# Patient Record
Sex: Male | Born: 1975 | Hispanic: No | Marital: Married | Smoking: Never smoker
Health system: Southern US, Community
[De-identification: ages and names within clinical notes are randomized; demographics above are authoritative.]

## PROBLEM LIST (undated history)

## (undated) DIAGNOSIS — I10 Essential (primary) hypertension: Secondary | ICD-10-CM

## (undated) DIAGNOSIS — E119 Type 2 diabetes mellitus without complications: Secondary | ICD-10-CM

---

## 2011-12-29 ENCOUNTER — Ambulatory Visit: Payer: PRIVATE HEALTH INSURANCE

## 2016-04-07 ENCOUNTER — Emergency Department (HOSPITAL_COMMUNITY)
Admission: EM | Admit: 2016-04-07 | Discharge: 2016-04-07 | Disposition: A | Discharge status: Home / Self Care | Payer: 59 | Attending: Emergency Medicine | Admitting: Emergency Medicine

## 2016-04-07 ENCOUNTER — Encounter (HOSPITAL_COMMUNITY): Payer: Self-pay | Admitting: *Deleted

## 2016-04-07 ENCOUNTER — Emergency Department (HOSPITAL_COMMUNITY): Payer: 59

## 2016-04-07 DIAGNOSIS — E1122 Type 2 diabetes mellitus with diabetic chronic kidney disease: Secondary | ICD-10-CM | POA: Diagnosis not present

## 2016-04-07 DIAGNOSIS — R1013 Epigastric pain: Secondary | ICD-10-CM | POA: Diagnosis not present

## 2016-04-07 DIAGNOSIS — N189 Chronic kidney disease, unspecified: Secondary | ICD-10-CM | POA: Diagnosis not present

## 2016-04-07 DIAGNOSIS — I129 Hypertensive chronic kidney disease with stage 1 through stage 4 chronic kidney disease, or unspecified chronic kidney disease: Secondary | ICD-10-CM | POA: Diagnosis not present

## 2016-04-07 DIAGNOSIS — R1012 Left upper quadrant pain: Secondary | ICD-10-CM | POA: Insufficient documentation

## 2016-04-07 HISTORY — DX: Type 2 diabetes mellitus without complications: E11.9

## 2016-04-07 HISTORY — DX: Essential (primary) hypertension: I10

## 2016-04-07 LAB — CBC
HCT: 46.8 % (ref 39.0–52.0)
HEMOGLOBIN: 15.8 g/dL (ref 13.0–17.0)
MCH: 28.3 pg (ref 26.0–34.0)
MCHC: 33.8 g/dL (ref 30.0–36.0)
MCV: 83.9 fL (ref 78.0–100.0)
PLATELETS: 278 10*3/uL (ref 150–400)
RBC: 5.58 MIL/uL (ref 4.22–5.81)
RDW: 12.7 % (ref 11.5–15.5)
WBC: 8.4 10*3/uL (ref 4.0–10.5)

## 2016-04-07 LAB — COMPREHENSIVE METABOLIC PANEL
ALT: 18 U/L (ref 17–63)
AST: 17 U/L (ref 15–41)
Albumin: 4.2 g/dL (ref 3.5–5.0)
Alkaline Phosphatase: 87 U/L (ref 38–126)
Anion gap: 7 (ref 5–15)
BILIRUBIN TOTAL: 1 mg/dL (ref 0.3–1.2)
BUN: 16 mg/dL (ref 6–20)
CALCIUM: 9.4 mg/dL (ref 8.9–10.3)
CO2: 27 mmol/L (ref 22–32)
CREATININE: 1.6 mg/dL — AB (ref 0.61–1.24)
Chloride: 103 mmol/L (ref 101–111)
GFR calc Af Amer: >60 mL/min (ref >60)
GFR, EST NON AFRICAN AMERICAN: 52 mL/min — AB (ref >60)
Glucose, Bld: 89 mg/dL (ref 65–99)
POTASSIUM: 4.1 mmol/L (ref 3.5–5.1)
Sodium: 137 mmol/L (ref 135–145)
TOTAL PROTEIN: 7.8 g/dL (ref 6.5–8.1)

## 2016-04-07 LAB — LIPASE, BLOOD: Lipase: 39 U/L (ref 11–51)

## 2016-04-07 LAB — TROPONIN I

## 2016-04-07 MED ORDER — IOPAMIDOL (ISOVUE-300) INJECTION 61%
100.0000 mL | Freq: Once | INTRAVENOUS | Status: AC | PRN
  Administered 2016-04-07: 100 mL via INTRAVENOUS

## 2016-04-07 MED ORDER — GI COCKTAIL ~~LOC~~
30.0000 mL | Freq: Once | ORAL | Status: AC
  Administered 2016-04-07: 30 mL via ORAL
  Filled 2016-04-07: qty 30

## 2016-04-07 NOTE — Discharge Instructions (Signed)
Abdominal Pain, Adult Take Prilosec OTC as directed. Contact Dr. Mardelle MatteAndy tomorrow for referral to a gastroenterologist Many things can cause abdominal pain. Usually, abdominal pain is not caused by a disease and will improve without treatment. It can often be observed and treated at home. Your health care provider will do a physical exam and possibly order blood tests and X-rays to help determine the seriousness of your pain. However, in many cases, more time must pass before a clear cause of the pain can be found. Before that point, your health care provider may not know if you need more testing or further treatment. HOME CARE INSTRUCTIONS Monitor your abdominal pain for any changes. The following actions may help to alleviate any discomfort you are experiencing:  Only take over-the-counter or prescription medicines as directed by your health care provider.  Do not take laxatives unless directed to do so by your health care provider.  Try a clear liquid diet (broth, tea, or water) as directed by your health care provider. Slowly move to a bland diet as tolerated. SEEK MEDICAL CARE IF:  You have unexplained abdominal pain.  You have abdominal pain associated with nausea or diarrhea.  You have pain when you urinate or have a bowel movement.  You experience abdominal pain that wakes you in the night.  You have abdominal pain that is worsened or improved by eating food.  You have abdominal pain that is worsened with eating fatty foods.  You have a fever. SEEK IMMEDIATE MEDICAL CARE IF:  Your pain does not go away within 2 hours.  You keep throwing up (vomiting).  Your pain is felt only in portions of the abdomen, such as the right side or the left lower portion of the abdomen.  You pass bloody or black tarry stools. MAKE SURE YOU:  Understand these instructions.  Will watch your condition.  Will get help right away if you are not doing well or get worse.   This information is not  intended to replace advice given to you by your health care provider. Make sure you discuss any questions you have with your health care provider.   Document Released: 08/05/2005 Document Revised: 07/17/2015 Document Reviewed: 07/05/2013 Elsevier Interactive Patient Education [employer]2016 Elsevier Inc.

## 2016-04-07 NOTE — ED Notes (Signed)
Pt c/o LUQ pain onset x 1 mth, pt seen @ PCP office x 5-6 times for his symptoms, pt denies n/v/d, pt c/o bil lower back onset x 1 wk, pt reports dysuria, denies hematuria & urinary frequency, denies SOB & CP, A&O x4

## 2016-04-07 NOTE — ED Provider Notes (Signed)
CSN: [id number]     Arrival date & time 04/07/16  1201 History   First MD Initiated Contact with Patient 04/07/16 1314     Chief Complaint  Patient presents with  . Abdominal Pain  . Back Pain     (Consider location/radiation/quality/duration/timing/severity/associated sxs/prior Treatment) HPI Complains of epigastric abdominal pain and left upper quadrant pain rating to left back for one month. Pain waxes and wanes nothing makes pain better or worse. Patient reports she's had abdominal ultrasounds which were negative, ordered by his primary care physician. Treated with tramadol with transient relief. Pain is not affected by eating or by exertion. No other associated symptoms. Denies urinary symptoms. Denies fever. Past Medical History  Diagnosis Date  . Diabetes mellitus without complication (HCC)   . Hypertension    History reviewed. No pertinent past surgical history. No family history on file. Social History  Substance Use Topics  . Smoking status: Never Smoker   . Smokeless tobacco: None  . Alcohol Use: No    Review of Systems  Constitutional: Negative.   HENT: Negative.   Respiratory: Negative.   Cardiovascular: Negative.   Gastrointestinal: Positive for abdominal pain.  Musculoskeletal: Positive for back pain.  Skin: Negative.   Allergic/Immunologic: Positive for immunocompromised state.       Diabetic  Neurological: Negative.   Psychiatric/Behavioral: Negative.   All other systems reviewed and are negative.     Allergies  Review of patient's allergies indicates no known allergies.  Home Medications   Prior to Admission medications   Not on File   BP 134/87 mmHg  Pulse 64  Temp(Src) 97.9 F (36.6 C) (Oral)  Resp 18  Ht 5\' 5"  (1.651 m)  Wt 154 lb 6 oz (70.024 kg)  BMI 25.69 kg/m2  SpO2 98% Physical Exam  Constitutional: He appears well-developed and well-nourished.  HENT:  Head: Normocephalic and atraumatic.  Eyes: Conjunctivae are normal. Pupils  are equal, round, and reactive to light.  Neck: Neck supple. No tracheal deviation present. No thyromegaly present.  Cardiovascular: Normal rate and regular rhythm.   No murmur heard. Pulmonary/Chest: Effort normal and breath sounds normal.  Abdominal: Soft. Bowel sounds are normal. He exhibits no distension and no mass. There is tenderness. There is no rebound and no guarding.  Tender at epigastrium and left upper quadrant  Genitourinary: Penis normal.  Normal genitalia no no flank tenderness  Musculoskeletal: Normal range of motion. He exhibits no edema or tenderness.  Neurological: He is alert. Coordination normal.  Skin: Skin is warm and dry. No rash noted.  Psychiatric: He has a normal mood and affect.  Nursing note and vitals reviewed.   ED Course  Procedures (including critical care time) Labs Review Labs Reviewed  COMPREHENSIVE METABOLIC PANEL - Abnormal; Notable for the following:    Creatinine, Ser 1.60 (*)    GFR calc non Af Amer 52 (*)    All other components within normal limits  LIPASE, BLOOD  CBC  URINALYSIS, ROUTINE W REFLEX MICROSCOPIC (NOT AT [facility])  TROPONIN I    Imaging Review No results found. I have personally reviewed and evaluated these images and lab results as part of my medical decision-making.   EKG Interpretation None     4:40 PM discomfort improved after treatment GI cocktail. Results for orders placed or performed during the hospital encounter of 04/07/16  Lipase, blood  Result Value Ref Range   Lipase 39 11 - 51 U/L  Comprehensive metabolic panel  Result Value Ref Range  Sodium 137 135 - 145 mmol/L   Potassium 4.1 3.5 - 5.1 mmol/L   Chloride 103 101 - 111 mmol/L   CO2 27 22 - 32 mmol/L   Glucose, Bld 89 65 - 99 mg/dL   BUN 16 6 - 20 mg/dL   Creatinine, Ser 1.61 (H) 0.61 - 1.24 mg/dL   Calcium 9.4 8.9 - 09.6 mg/dL   Total Protein 7.8 6.5 - 8.1 g/dL   Albumin 4.2 3.5 - 5.0 g/dL   AST 17 15 - 41 U/L   ALT 18 17 - 63 U/L   Alkaline  Phosphatase 87 38 - 126 U/L   Total Bilirubin 1.0 0.3 - 1.2 mg/dL   GFR calc non Af Amer 52 (L) >60 mL/min   GFR calc Af Amer >60 >60 mL/min   Anion gap 7 5 - 15  CBC  Result Value Ref Range   WBC 8.4 4.0 - 10.5 K/uL   RBC 5.58 4.22 - 5.81 MIL/uL   Hemoglobin 15.8 13.0 - 17.0 g/dL   HCT 04.5 40.9 - 81.1 %   MCV 83.9 78.0 - 100.0 fL   MCH 28.3 26.0 - 34.0 pg   MCHC 33.8 30.0 - 36.0 g/dL   RDW 91.4 78.2 - 95.6 %   Platelets 278 150 - 400 K/uL  Troponin I  Result Value Ref Range   Troponin I <0.03 <0.031 ng/mL   Ct Abdomen Pelvis W Contrast  04/07/2016  CLINICAL DATA:  Abdominal pain and tenderness for 1 month EXAM: CT ABDOMEN AND PELVIS WITH CONTRAST TECHNIQUE: Multidetector CT imaging of the abdomen and pelvis was performed using the standard protocol following bolus administration of intravenous contrast. CONTRAST:  ISOVUE-300 IOPAMIDOL (ISOVUE-300) INJECTION 61% COMPARISON:  None. FINDINGS: Lower chest: There is minimal scarring in the inferior lateral left base. Lung bases otherwise are clear. Hepatobiliary: There is a degree of hepatic steatosis. No focal liver lesions are identified gallbladder wall is not appreciably thickened. There is no biliary duct dilatation. Pancreas: No pancreatic lesions are evident. Spleen: No splenic lesions are evident. Adrenals/Urinary Tract: Adrenals appear unremarkable. There are scattered areas of renal scarring on the right. There is a cyst in the lower pole the right kidney anteriorly measuring 1.4 x 1.3 cm. There is a cyst nearby in the lower pole right kidney measuring 1.0 x 0.8 cm. There are two adjacent cysts in the left frontal region measuring 0.8 x 0.7 cm and 1.1 x 1.0 cm respectively. There is mild scarring in the lateral left kidney region. There is no hydronephrosis on either side. There is no renal or ureteral calculus on either side. Urinary bladder is midline with wall thickness within normal limits. Stomach/Bowel: There is no bowel wall  or mesenteric thickening. No bowel obstruction. No free air or portal venous air. Vascular/Lymphatic: There are foci of atherosclerotic calcification in the distal aorta and proximal common iliac arteries. No abdominal aortic aneurysm is evident. The major mesenteric vessels appear patent. A circumaortic left renal vein is noted, an anatomic variant. Reproductive: Prostate and seminal vesicles appear unremarkable. There is no pelvic mass or pelvic fluid collection. Other: The appendix appears normal. There is no ascites or abscess in the abdomen or pelvis. Musculoskeletal: There is disc degeneration at L5-S1. There are no blastic or lytic bone lesions. No intramuscular or abdominal wall lesion. IMPRESSION: No bowel obstruction or bowel wall thickening. No abscess. Appendix appears normal. No renal or ureteral calculus.  No hydronephrosis. There is a degree of hepatic steatosis without  focal liver lesion evident. Electronically Signed   By: Bretta Bang III M.D.   On: 04/07/2016 15:24    MDM   case discussed with patient primary care physician Dr. Mardelle Matte. Creatinine of 1.6 is his baseline. Plan Prilosec OTC. Patient is to contact Dr. Mardelle Matte for referral to gastroenterologist  Diagnosis #1abdominal pain  #2 chronic renal insufficiency  Final diagnoses:  None        Doug Sou, MD 04/07/16 1643

## 2016-04-07 NOTE — ED Notes (Signed)
Patient d/c'd from IV, monitor, continuous pulse oximetry and blood pressure cuff; patient getting dressed to be discharged home 

## 2016-04-07 NOTE — ED Notes (Signed)
Checked to see if patient could provide an urine specimen; patient states that he will attempt

## 2016-04-07 NOTE — ED Notes (Signed)
Patient placed on continuous pulse oximetry and blood pressure cuff; Erin, RN handed patient an urinal to attempt to provide an urine sample

## 2016-04-07 NOTE — ED Notes (Signed)
Patient getting undressed and into a gown at this time 

## 2016-04-12 ENCOUNTER — Encounter (HOSPITAL_COMMUNITY): Payer: Self-pay

## 2016-04-12 ENCOUNTER — Emergency Department (HOSPITAL_COMMUNITY)
Admission: EM | Admit: 2016-04-12 | Discharge: 2016-04-13 | Disposition: A | Discharge status: Home / Self Care | Payer: 59 | Attending: Emergency Medicine | Admitting: Emergency Medicine

## 2016-04-12 DIAGNOSIS — R1013 Epigastric pain: Secondary | ICD-10-CM | POA: Diagnosis not present

## 2016-04-12 DIAGNOSIS — E119 Type 2 diabetes mellitus without complications: Secondary | ICD-10-CM | POA: Diagnosis not present

## 2016-04-12 DIAGNOSIS — Z7984 Long term (current) use of oral hypoglycemic drugs: Secondary | ICD-10-CM | POA: Insufficient documentation

## 2016-04-12 DIAGNOSIS — Z79899 Other long term (current) drug therapy: Secondary | ICD-10-CM | POA: Diagnosis not present

## 2016-04-12 DIAGNOSIS — R109 Unspecified abdominal pain: Secondary | ICD-10-CM | POA: Diagnosis present

## 2016-04-12 DIAGNOSIS — I10 Essential (primary) hypertension: Secondary | ICD-10-CM | POA: Insufficient documentation

## 2016-04-12 LAB — COMPREHENSIVE METABOLIC PANEL
ALT: 14 U/L — AB (ref 17–63)
AST: 16 U/L (ref 15–41)
Albumin: 4 g/dL (ref 3.5–5.0)
Alkaline Phosphatase: 87 U/L (ref 38–126)
Anion gap: 7 (ref 5–15)
BUN: 16 mg/dL (ref 6–20)
CHLORIDE: 102 mmol/L (ref 101–111)
CO2: 27 mmol/L (ref 22–32)
CREATININE: 1.58 mg/dL — AB (ref 0.61–1.24)
Calcium: 9.6 mg/dL (ref 8.9–10.3)
GFR calc non Af Amer: 53 mL/min — ABNORMAL LOW (ref >60)
Glucose, Bld: 121 mg/dL — ABNORMAL HIGH (ref 65–99)
Potassium: 4 mmol/L (ref 3.5–5.1)
SODIUM: 136 mmol/L (ref 135–145)
Total Bilirubin: 0.9 mg/dL (ref 0.3–1.2)
Total Protein: 7.8 g/dL (ref 6.5–8.1)

## 2016-04-12 LAB — URINE MICROSCOPIC-ADD ON

## 2016-04-12 LAB — CBC
HEMATOCRIT: 48.1 % (ref 39.0–52.0)
Hemoglobin: 16.9 g/dL (ref 13.0–17.0)
MCH: 28.6 pg (ref 26.0–34.0)
MCHC: 35.1 g/dL (ref 30.0–36.0)
MCV: 81.5 fL (ref 78.0–100.0)
PLATELETS: 287 10*3/uL (ref 150–400)
RBC: 5.9 MIL/uL — AB (ref 4.22–5.81)
RDW: 12 % (ref 11.5–15.5)
WBC: 10.1 10*3/uL (ref 4.0–10.5)

## 2016-04-12 LAB — URINALYSIS, ROUTINE W REFLEX MICROSCOPIC
Bilirubin Urine: NEGATIVE
GLUCOSE, UA: NEGATIVE mg/dL
Ketones, ur: NEGATIVE mg/dL
Leukocytes, UA: NEGATIVE
Nitrite: NEGATIVE
PH: 6.5 (ref 5.0–8.0)
PROTEIN: 100 mg/dL — AB
SPECIFIC GRAVITY, URINE: 1.012 (ref 1.005–1.030)

## 2016-04-12 LAB — LIPASE, BLOOD: Lipase: 35 U/L (ref 11–51)

## 2016-04-12 MED ORDER — FAMOTIDINE IN NACL 20-0.9 MG/50ML-% IV SOLN
20.0000 mg | Freq: Once | INTRAVENOUS | Status: AC
  Administered 2016-04-13: 20 mg via INTRAVENOUS
  Filled 2016-04-12: qty 50

## 2016-04-12 MED ORDER — SODIUM CHLORIDE 0.9 % IV BOLUS (SEPSIS)
500.0000 mL | Freq: Once | INTRAVENOUS | Status: AC
  Administered 2016-04-13: 500 mL via INTRAVENOUS

## 2016-04-12 MED ORDER — GI COCKTAIL ~~LOC~~
30.0000 mL | Freq: Once | ORAL | Status: AC
  Administered 2016-04-13: 30 mL via ORAL
  Filled 2016-04-12: qty 30

## 2016-04-12 NOTE — ED Provider Notes (Signed)
CSN: [id number]     Arrival date & time 04/12/16  1914 History  By signing my name below, I, Bethel BornBritney McCollum, attest that this documentation has been prepared under the direction and in the presence of Gilda Creasehristopher J Pollina, MD. Electronically Signed: Bethel BornBritney McCollum, ED Scribe. 04/12/2016. 11:25 PM   Chief Complaint  Patient presents with  . Abdominal Pain   The history is provided by the patient. No language interpreter was used.   Kenneth Diaz is a 40 y.o. male with PMHx of DM and HTN who presents to the Emergency Department complaining of constant, 8/10 in severity, waxing and waning, epigastric pain with onset more than 1 month ago. Pt states that he was seen in the ED 4 days ago and prescribed Prilosec which provided some improvement until tonight. Pt denies nausea, vomiting, diarrhea, and hematochezia.    Past Medical History  Diagnosis Date  . Diabetes mellitus without complication (HCC)   . Hypertension    History reviewed. No pertinent past surgical history. No family history on file. Social History  Substance Use Topics  . Smoking status: Never Smoker   . Smokeless tobacco: None  . Alcohol Use: No    Review of Systems  Gastrointestinal: Positive for abdominal pain. Negative for nausea, vomiting, diarrhea and blood in stool.  All other systems reviewed and are negative.  Allergies  Review of patient's allergies indicates no known allergies.  Home Medications   Prior to Admission medications   Medication Sig Start Date End Date Taking? Authorizing Provider  amLODipine (NORVASC) 10 MG tablet Take 10 mg by mouth daily. 01/25/16   Historical Provider, MD  atorvastatin (LIPITOR) 10 MG tablet Take 10 mg by mouth daily. 02/24/16   Historical Provider, MD  cyclobenzaprine (FLEXERIL) 10 MG tablet Take 10 mg by mouth at bedtime. 03/27/16   Historical Provider, MD  lisinopril (PRINIVIL,ZESTRIL) 10 MG tablet Take 10 mg by mouth daily. 01/09/16   Historical Provider, MD  metFORMIN  (GLUCOPHAGE) 500 MG tablet Take 500 mg by mouth 2 (two) times daily. 01/09/16   Historical Provider, MD  ondansetron (ZOFRAN) 4 MG tablet Take 4 mg by mouth every 8 (eight) hours as needed. nausea 03/27/16   Historical Provider, MD  pantoprazole (PROTONIX) 40 MG tablet Take 40 mg by mouth daily. 01/28/16   Historical Provider, MD  ranitidine (ZANTAC) 150 MG tablet Take 1 tablet (150 mg total) by mouth 2 (two) times daily. 04/13/16   Gilda Creasehristopher J Pollina, MD  sucralfate (CARAFATE) 1 GM/10ML suspension Take 10 mLs (1 g total) by mouth 4 (four) times daily -  with meals and at bedtime. 04/13/16   Gilda Creasehristopher J Pollina, MD  traMADol (ULTRAM) 50 MG tablet Take 50 mg by mouth every 6 (six) hours as needed. pain 01/20/16   Historical Provider, MD   BP 158/104 mmHg  Pulse 80  Temp(Src) 97.8 F (36.6 C) (Oral)  Ht 5\' 5"  (1.651 m)  Wt 151 lb 9.6 oz (68.765 kg)  BMI 25.23 kg/m2  SpO2 100% Physical Exam  Constitutional: He is oriented to person, place, and time. He appears well-developed and well-nourished. No distress.  HENT:  Head: Normocephalic and atraumatic.  Right Ear: Hearing normal.  Left Ear: Hearing normal.  Nose: Nose normal.  Mouth/Throat: Oropharynx is clear and moist and mucous membranes are normal.  Eyes: Conjunctivae and EOM are normal. Pupils are equal, round, and reactive to light.  Neck: Normal range of motion. Neck supple.  Cardiovascular: Regular rhythm, S1 normal and S2 normal.  Exam reveals no gallop and no friction rub.   No murmur heard. Pulmonary/Chest: Effort normal and breath sounds normal. No respiratory distress. He exhibits no tenderness.  Abdominal: Soft. Normal appearance and bowel sounds are normal. There is no hepatosplenomegaly. There is tenderness in the epigastric area. There is no rebound, no guarding, no tenderness at McBurney's point and negative Murphy's sign. No hernia.  Musculoskeletal: Normal range of motion.  Neurological: He is alert and oriented to person,  place, and time. He has normal strength. No cranial nerve deficit or sensory deficit. Coordination normal. GCS eye subscore is 4. GCS verbal subscore is 5. GCS motor subscore is 6.  Skin: Skin is warm, dry and intact. No rash noted. No cyanosis.  Psychiatric: He has a normal mood and affect. His speech is normal and behavior is normal. Thought content normal.  Nursing note and vitals reviewed.   ED Course  Procedures (including critical care time) DIAGNOSTIC STUDIES: Oxygen Saturation is 100% on RA,  normal by my interpretation.    COORDINATION OF CARE: 11:22 PM Discussed treatment plan which includes lab work with pt at bedside and pt agreed to plan.  Labs Review Labs Reviewed  COMPREHENSIVE METABOLIC PANEL - Abnormal; Notable for the following:    Glucose, Bld 121 (*)    Creatinine, Ser 1.58 (*)    ALT 14 (*)    GFR calc non Af Amer 53 (*)    All other components within normal limits  CBC - Abnormal; Notable for the following:    RBC 5.90 (*)    All other components within normal limits  URINALYSIS, ROUTINE W REFLEX MICROSCOPIC (NOT AT [facility]) - Abnormal; Notable for the following:    Hgb urine dipstick TRACE (*)    Protein, ur 100 (*)    All other components within normal limits  URINE MICROSCOPIC-ADD ON - Abnormal; Notable for the following:    Squamous Epithelial / LPF 0-5 (*)    Bacteria, UA RARE (*)    All other components within normal limits  LIPASE, BLOOD    Imaging Review No results found. I have personally reviewed and evaluated these lab results as part of my medical decision-making.   EKG Interpretation None      MDM   Final diagnoses:  Epigastric pain    Patient presents to the ER for evaluation of epigastric pain. Reviewing his records reveals that he was seen in the ER this past week. He has been worked up for this by his primary doctor including ultrasound. At time of his last visit, Dr. Rennis Chris discussed the patient with his primary doctor and  arrangements were made for gastroenterology follow-up. Patient indicates that his follow-up visit in 2 days. Patient is already prescribed Protonix. Will add ranitidine and Carafate. Patient improved with analgesia, Pepcid, GI cocktail here in the ER. Examination is benign.  I personally performed the services described in this documentation, which was scribed in my presence. The recorded information has been reviewed and is accurate.    Gilda Crease, MD 04/13/16 [phone]

## 2016-04-12 NOTE — ED Notes (Signed)
patient complains of ongoing epigastric pain since visit on 5/30. Pain worse after eating. No vomiting, no diarrhea.

## 2016-04-13 MED ORDER — SUCRALFATE 1 GM/10ML PO SUSP: 1.0000 g | Freq: Three times a day (TID) | ORAL | Status: DC

## 2016-04-13 MED ORDER — HYDROMORPHONE HCL 1 MG/ML IJ SOLN
1.0000 mg | Freq: Once | INTRAMUSCULAR | Status: AC
  Administered 2016-04-13: 1 mg via INTRAVENOUS
  Filled 2016-04-13: qty 1

## 2016-04-13 MED ORDER — RANITIDINE HCL 150 MG PO TABS: 150.0000 mg | ORAL_TABLET | Freq: Two times a day (BID) | ORAL | Status: DC

## 2016-04-13 MED ORDER — ONDANSETRON HCL 4 MG/2ML IJ SOLN
4.0000 mg | Freq: Once | INTRAMUSCULAR | Status: AC
  Administered 2016-04-13: 4 mg via INTRAVENOUS
  Filled 2016-04-13: qty 2

## 2016-04-13 NOTE — Discharge Instructions (Signed)

## 2016-12-28 ENCOUNTER — Encounter (HOSPITAL_COMMUNITY): Payer: Self-pay

## 2016-12-28 ENCOUNTER — Emergency Department (HOSPITAL_COMMUNITY)
Admission: EM | Admit: 2016-12-28 | Discharge: 2016-12-28 | Disposition: A | Discharge status: Home / Self Care | Payer: Managed Care, Other (non HMO) | Attending: Emergency Medicine | Admitting: Emergency Medicine

## 2016-12-28 DIAGNOSIS — Y92410 Unspecified street and highway as the place of occurrence of the external cause: Secondary | ICD-10-CM | POA: Diagnosis not present

## 2016-12-28 DIAGNOSIS — S199XXA Unspecified injury of neck, initial encounter: Secondary | ICD-10-CM | POA: Insufficient documentation

## 2016-12-28 DIAGNOSIS — I10 Essential (primary) hypertension: Secondary | ICD-10-CM | POA: Insufficient documentation

## 2016-12-28 DIAGNOSIS — Y999 Unspecified external cause status: Secondary | ICD-10-CM | POA: Insufficient documentation

## 2016-12-28 DIAGNOSIS — Y939 Activity, unspecified: Secondary | ICD-10-CM | POA: Insufficient documentation

## 2016-12-28 DIAGNOSIS — S299XXA Unspecified injury of thorax, initial encounter: Secondary | ICD-10-CM | POA: Insufficient documentation

## 2016-12-28 DIAGNOSIS — E119 Type 2 diabetes mellitus without complications: Secondary | ICD-10-CM | POA: Diagnosis not present

## 2016-12-28 MED ORDER — NAPROXEN 500 MG PO TABS: 500.0000 mg | ORAL_TABLET | Freq: Two times a day (BID) | ORAL | 0 refills | Status: DC

## 2016-12-28 MED ORDER — METHOCARBAMOL 500 MG PO TABS: 500.0000 mg | ORAL_TABLET | Freq: Two times a day (BID) | ORAL | 0 refills | Status: DC

## 2016-12-28 MED ORDER — IBUPROFEN 200 MG PO TABS
600.0000 mg | ORAL_TABLET | Freq: Once | ORAL | Status: AC
  Administered 2016-12-28: 600 mg via ORAL
  Filled 2016-12-28: qty 1
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
## 2017-08-28 ENCOUNTER — Ambulatory Visit (INDEPENDENT_AMBULATORY_CARE_PROVIDER_SITE_OTHER): Payer: Medicaid Other

## 2017-08-28 ENCOUNTER — Encounter (HOSPITAL_COMMUNITY): Payer: Self-pay | Admitting: Emergency Medicine

## 2017-08-28 ENCOUNTER — Ambulatory Visit (HOSPITAL_COMMUNITY)
Admission: EM | Admit: 2017-08-28 | Discharge: 2017-08-28 | Disposition: A | Discharge status: Home / Self Care | Payer: Medicaid Other | Attending: Family Medicine | Admitting: Family Medicine

## 2017-08-28 DIAGNOSIS — R1012 Left upper quadrant pain: Secondary | ICD-10-CM

## 2017-08-28 DIAGNOSIS — R109 Unspecified abdominal pain: Secondary | ICD-10-CM

## 2017-08-28 NOTE — Discharge Instructions (Signed)
Please return here or to the Emergency Department immediately should you feel worse in any way or have any of the following symptoms: increasing or different abdominal pain, persistent vomiting, fevers, or shaking chills. ° ° °

## 2017-08-28 NOTE — ED Triage Notes (Signed)
Pt c/o intermittent left flank pain onset yest ....   Hx of gastric ulcer??  Denies fevers, chills, n/v  A&O x4... NAD... Ambulatory

## 2017-08-30 NOTE — ED Provider Notes (Signed)
  [facility]   [id number] 08/28/17 Arrival Time: 1705  ASSESSMENT & PLAN:  1. Abdominal discomfort in left upper quadrant    Unclear etiology at this time. No treatment initiation. He prefers to monitor closely over the next 24 hours since his symptoms basically began within the last day. Benign exam. ED if worsening. Reviewed expectations re: course of current medical issues. Questions answered. Outlined signs and symptoms indicating need for more acute intervention. Patient verbalized understanding. After Visit Summary given.   SUBJECTIVE:  Priscille KluverOsman Salvucci is a 41 y.o. male who presents with complaint of abdominal discomfort. Onset gradual, 1 day ago. Described as cramping. Location: left upper and lower abdomen without radiation. Described symptoms are on and off lasting a few minutes at a time. Does not limit him. Aggravating factors: none. Alleviating factors: none. Associated symptoms: none. The patient denies belching, diarrhea, dysuria, fever, myalgias, nausea and vomiting. Appetite: normal. PO intake: normal. Ambulatory without assistance. Urinary symptoms: none. Last bowel movement today without blood ("but small") OTC treatment: None.  History of similar symptoms: No.  History reviewed. No pertinent surgical history.  ROS: As per HPI.  OBJECTIVE:  Vitals:   08/28/17 1730  BP: (!) 145/89  Resp: 18  Temp: 98 F (36.7 C)  TempSrc: Oral  SpO2: 100%    General appearance: alert; no distress Lungs: clear to auscultation bilaterally Heart: regular rate and rhythm Abdomen: soft; non-distended; no tenderness; bowel sounds present; no masses or organomegaly; no guarding or rebound tenderness Back: no CVA tenderness Extremities: no edema; symmetrical with no gross deformities Skin: warm and dry Neurologic: normal gait Psychological: alert and cooperative; normal mood and affect  Imaging: Dg Abd 2 Views  Result Date: 08/28/2017 CLINICAL DATA:  Left-sided  abdominal pain ir 2 days EXAM: ABDOMEN - 2 VIEW COMPARISON:  04/07/2016 FINDINGS: Scattered large and small bowel gas is noted. A mild amount of fecal material is noted within the colon. No free air is seen. No abnormal mass or abnormal calcifications are noted. IMPRESSION: No acute abnormality noted. Electronically Signed   By: Alcide CleverMark  Lukens M.D.   On: 08/28/2017 18:32    No Known Allergies                                             Past Medical History:  Diagnosis Date  . Diabetes mellitus without complication (HCC)   . Hypertension    Social History   Social History  . Marital status: Married    Spouse name: N/A  . Number of children: N/A  . Years of education: N/A   Occupational History  . Not on file.   Social History Main Topics  . Smoking status: Never Smoker  . Smokeless tobacco: Never Used  . Alcohol use No  . Drug use: No  . Sexual activity: Not on file   Other Topics Concern  . Not on file   Social History Narrative  . No narrative on file      Mardella LaymanHagler, Liadan Guizar, MD 08/30/17 [phone]

## 2017-10-15 ENCOUNTER — Encounter (HOSPITAL_COMMUNITY): Payer: Self-pay

## 2017-10-15 DIAGNOSIS — E119 Type 2 diabetes mellitus without complications: Secondary | ICD-10-CM | POA: Diagnosis not present

## 2017-10-15 DIAGNOSIS — I10 Essential (primary) hypertension: Secondary | ICD-10-CM | POA: Insufficient documentation

## 2017-10-15 DIAGNOSIS — N289 Disorder of kidney and ureter, unspecified: Secondary | ICD-10-CM | POA: Insufficient documentation

## 2017-10-15 DIAGNOSIS — R1013 Epigastric pain: Secondary | ICD-10-CM | POA: Insufficient documentation

## 2017-10-15 DIAGNOSIS — Z79899 Other long term (current) drug therapy: Secondary | ICD-10-CM | POA: Insufficient documentation

## 2017-10-15 LAB — URINALYSIS, ROUTINE W REFLEX MICROSCOPIC
BACTERIA UA: NONE SEEN
BILIRUBIN URINE: NEGATIVE
Glucose, UA: NEGATIVE mg/dL
KETONES UR: NEGATIVE mg/dL
LEUKOCYTES UA: NEGATIVE
Nitrite: NEGATIVE
PROTEIN: 100 mg/dL — AB
SQUAMOUS EPITHELIAL / LPF: NONE SEEN
Specific Gravity, Urine: 1.009 (ref 1.005–1.030)
pH: 7 (ref 5.0–8.0)

## 2017-10-15 LAB — CBC
HEMATOCRIT: 48.7 % (ref 39.0–52.0)
Hemoglobin: 17.1 g/dL — ABNORMAL HIGH (ref 13.0–17.0)
MCH: 29.7 pg (ref 26.0–34.0)
MCHC: 35.1 g/dL (ref 30.0–36.0)
MCV: 84.7 fL (ref 78.0–100.0)
Platelets: 272 10*3/uL (ref 150–400)
RBC: 5.75 MIL/uL (ref 4.22–5.81)
RDW: 13 % (ref 11.5–15.5)
WBC: 9.7 10*3/uL (ref 4.0–10.5)

## 2017-10-15 LAB — COMPREHENSIVE METABOLIC PANEL
ALT: 25 U/L (ref 17–63)
AST: 20 U/L (ref 15–41)
Albumin: 3.9 g/dL (ref 3.5–5.0)
Alkaline Phosphatase: 90 U/L (ref 38–126)
Anion gap: 9 (ref 5–15)
BUN: 20 mg/dL (ref 6–20)
CHLORIDE: 99 mmol/L — AB (ref 101–111)
CO2: 27 mmol/L (ref 22–32)
Calcium: 9.5 mg/dL (ref 8.9–10.3)
Creatinine, Ser: 1.5 mg/dL — ABNORMAL HIGH (ref 0.61–1.24)
GFR calc Af Amer: >60 mL/min (ref >60)
GFR calc non Af Amer: 56 mL/min — ABNORMAL LOW (ref >60)
Glucose, Bld: 129 mg/dL — ABNORMAL HIGH (ref 65–99)
POTASSIUM: 3.4 mmol/L — AB (ref 3.5–5.1)
SODIUM: 135 mmol/L (ref 135–145)
Total Bilirubin: 0.3 mg/dL (ref 0.3–1.2)
Total Protein: 7.6 g/dL (ref 6.5–8.1)

## 2017-10-15 LAB — LIPASE, BLOOD: LIPASE: 42 U/L (ref 11–51)

## 2017-10-15 NOTE — ED Triage Notes (Signed)
Pt states that for the past several months he has had upper abd pain without n/v/d/fevers

## 2017-10-16 ENCOUNTER — Emergency Department (HOSPITAL_COMMUNITY)
Admission: EM | Admit: 2017-10-16 | Discharge: 2017-10-16 | Disposition: A | Discharge status: Home / Self Care | Payer: Medicaid Other | Attending: Emergency Medicine | Admitting: Emergency Medicine

## 2017-10-16 ENCOUNTER — Other Ambulatory Visit: Payer: Self-pay

## 2017-10-16 DIAGNOSIS — R1013 Epigastric pain: Secondary | ICD-10-CM

## 2017-10-16 DIAGNOSIS — N289 Disorder of kidney and ureter, unspecified: Secondary | ICD-10-CM

## 2017-10-16 MED ORDER — SUCRALFATE 1 G PO TABS
1.0000 g | ORAL_TABLET | Freq: Once | ORAL | Status: AC
  Administered 2017-10-16: 1 g via ORAL
  Filled 2017-10-16: qty 1

## 2017-10-16 MED ORDER — PANTOPRAZOLE SODIUM 40 MG PO TBEC: 40.0000 mg | DELAYED_RELEASE_TABLET | Freq: Every day | ORAL | 1 refills | Status: DC

## 2017-10-16 MED ORDER — GI COCKTAIL ~~LOC~~
30.0000 mL | Freq: Once | ORAL | Status: AC
  Administered 2017-10-16: 30 mL via ORAL
  Filled 2017-10-16: qty 30

## 2017-10-16 MED ORDER — PANTOPRAZOLE SODIUM 40 MG PO TBEC
40.0000 mg | DELAYED_RELEASE_TABLET | Freq: Once | ORAL | Status: AC
  Administered 2017-10-16: 40 mg via ORAL
  Filled 2017-10-16: qty 1

## 2017-10-16 MED ORDER — SUCRALFATE 1 G PO TABS: 1.0000 g | ORAL_TABLET | Freq: Three times a day (TID) | ORAL | 0 refills | Status: DC

## 2017-10-16 NOTE — ED Provider Notes (Signed)
[facility]   CSN: [id number] Arrival date & time: 10/15/17  2026     History   Chief Complaint Chief Complaint  Patient presents with  . Abdominal Pain    HPI Kenneth Diaz is a 41 y.o. male.  The history is provided by the patient.  He has history of diabetes and hypertension.  He complains of epigastric pain with some radiation to the left upper quadrant.  This is been present for about the last week, but worse since this afternoon.  He describes the pain as sharp and rates it at 8/10.  It seems to be better after eating, but returns a few hours after he eats.  There is no radiation of pain to the back.  There is no associated nausea, vomiting, diarrhea.  He has not taken any medication for it.  He did have a similar episode about a month ago and he went to an urgent care center without a diagnosis being made.  He denies ethanol use, drug use, NSAID use.  Past Medical History:  Diagnosis Date  . Diabetes mellitus without complication (HCC)   . Hypertension     There are no active problems to display for this patient.   History reviewed. No pertinent surgical history.     Home Medications    Prior to Admission medications   Medication Sig Start Date End Date Taking? Authorizing Provider  amLODipine (NORVASC) 10 MG tablet Take 10 mg by mouth daily. 01/25/16   [provider]  atorvastatin (LIPITOR) 10 MG tablet Take 10 mg by mouth daily. 02/24/16   [provider]  cyclobenzaprine (FLEXERIL) 10 MG tablet Take 10 mg by mouth at bedtime. 03/27/16   [provider]  lisinopril (PRINIVIL,ZESTRIL) 10 MG tablet Take 10 mg by mouth daily. 01/09/16   [provider]  metFORMIN (GLUCOPHAGE) 500 MG tablet Take 500 mg by mouth 2 (two) times daily. 01/09/16   [provider]  methocarbamol (ROBAXIN) 500 MG tablet Take 1 tablet (500 mg total) by mouth 2 (two) times daily. 12/28/16   Felicie MornSmith,  Alora Gorey, NP  naproxen (NAPROSYN) 500 MG tablet Take 1 tablet (500 mg total) by mouth 2 (two) times daily. 12/28/16   Felicie MornSmith, Ronald Vinsant, NP  ondansetron (ZOFRAN) 4 MG tablet Take 4 mg by mouth every 8 (eight) hours as needed. nausea 03/27/16   [provider]  pantoprazole (PROTONIX) 40 MG tablet Take 40 mg by mouth daily. 01/28/16   [provider]  ranitidine (ZANTAC) 150 MG tablet Take 1 tablet (150 mg total) by mouth 2 (two) times daily. 04/13/16   Gilda CreasePollina, Christopher J, MD  sucralfate (CARAFATE) 1 GM/10ML suspension Take 10 mLs (1 g total) by mouth 4 (four) times daily -  with meals and at bedtime. 04/13/16   Gilda CreasePollina, Christopher J, MD  traMADol (ULTRAM) 50 MG tablet Take 50 mg by mouth every 6 (six) hours as needed. pain 01/20/16   [provider]    Family History No family history on file.  Social History Social History   Tobacco Use  . Smoking status: Never Smoker  . Smokeless tobacco: Never Used  Substance Use Topics  . Alcohol use: No  . Drug use: No     Allergies   Patient has no known allergies.   Review of Systems Review of Systems  All other systems reviewed and are negative.    Physical Exam Updated Vital Signs BP (!) 137/92 (BP Location: Right Arm)  Pulse 68   Temp 98 F (36.7 C) (Oral)   Resp 17   Ht 5\' 5"  (1.651 m)   Wt 69.9 kg (154 lb)   SpO2 100%   BMI 25.63 kg/m   Physical Exam  Nursing note and vitals reviewed.  41 year old male, resting comfortably and in no acute distress. Vital signs are significant for borderline hypertension. Oxygen saturation is 100%, which is normal. Head is normocephalic and atraumatic. PERRLA, EOMI. Oropharynx is clear. Neck is nontender and supple without adenopathy or JVD. Back is nontender and there is no CVA tenderness. Lungs are clear without rales, wheezes, or rhonchi. Chest is nontender. Heart has regular rate and rhythm without murmur. Abdomen is soft, flat, with mild epigastric tenderness.   There is no rebound or guarding.  There are no masses or hepatosplenomegaly and peristalsis is normoactive. Extremities have no cyanosis or edema, full range of motion is present. Skin is warm and dry without rash. Neurologic: Mental status is normal, cranial nerves are intact, there are no motor or sensory deficits.  ED Treatments / Results  Labs (all labs ordered are listed, but only abnormal results are displayed) Labs Reviewed  COMPREHENSIVE METABOLIC PANEL - Abnormal; Notable for the following components:      Result Value   Potassium 3.4 (*)    Chloride 99 (*)    Glucose, Bld 129 (*)    Creatinine, Ser 1.50 (*)    GFR calc non Af Amer 56 (*)    All other components within normal limits  CBC - Abnormal; Notable for the following components:   Hemoglobin 17.1 (*)    All other components within normal limits  URINALYSIS, ROUTINE W REFLEX MICROSCOPIC - Abnormal; Notable for the following components:   Color, Urine STRAW (*)    Hgb urine dipstick SMALL (*)    Protein, ur 100 (*)    All other components within normal limits  LIPASE, BLOOD   Procedures Procedures (including critical care time)  Medications Ordered in ED Medications  sucralfate (CARAFATE) tablet 1 g (not administered)  gi cocktail (Maalox,Lidocaine,Donnatal) (30 mLs Oral Given 10/16/17 0119)  pantoprazole (PROTONIX) EC tablet 40 mg (40 mg Oral Given 10/16/17 0119)     Initial Impression / Assessment and Plan / ED Course  I have reviewed the triage vital signs and the nursing notes.  Pertinent lab results that were available during my care of the patient were reviewed by me and considered in my medical decision making (see chart for details).  Abdominal pain and pattern strongly suggestive of peptic ulcer disease.  Old records are reviewed, and he was seen at urgent care on October 20 with no diagnosis or treatment.  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  Is not currently on any H2 blocker or proton pump inhibitors.  Laboratory workup shows renal insufficiency which is unchanged from baseline.  He is given a dose of GI cocktail with no improvement.  He is given a dose of sucralfate.  He is discharged with prescription for pantoprazole and sucralfate.  Advised to follow-up with PCP in 1 week.  May need to follow-up with his gastroenterologist for evaluation of possible recurrent H. pylori disease.  Final Clinical Impressions(s) / ED Diagnoses   Final diagnoses:  Epigastric pain  Renal insufficiency    ED Discharge Orders        Ordered    pantoprazole (PROTONIX) 40 MG tablet  Daily     10/16/17 0142    sucralfate (CARAFATE) 1 g tablet  3 times daily with meals & bedtime     10/16/17 0142       Dione Booze, MD 10/16/17 [phone]

## 2017-10-16 NOTE — Discharge Instructions (Signed)
Take antacids as needed. Return if pain is getting worse.  Talk with your doctor about

## 2018-04-19 IMAGING — CT CT ABD-PELV W/ CM
2 of 5 series · 15 of 46 positions shown, 17 images · IV contrast (Omni 300)
Comparison: None.

CLINICAL DATA: Abdominal pain and tenderness for 1 month

EXAM:
CT ABDOMEN AND PELVIS WITH CONTRAST
TECHNIQUE: Multidetector CT imaging of the abdomen and pelvis was performed
using the standard protocol following bolus administration of
intravenous contrast.
CONTRAST:  100mL [id number] IOPAMIDOL ([id number]) INJECTION 61%

[Series 2: a/p w/ 5mm · axial · 0.72mm/px · z∈[-941,-516]mm · 12 of 97 slices shown, 14 images]
[im 6/97  soft-tissue]
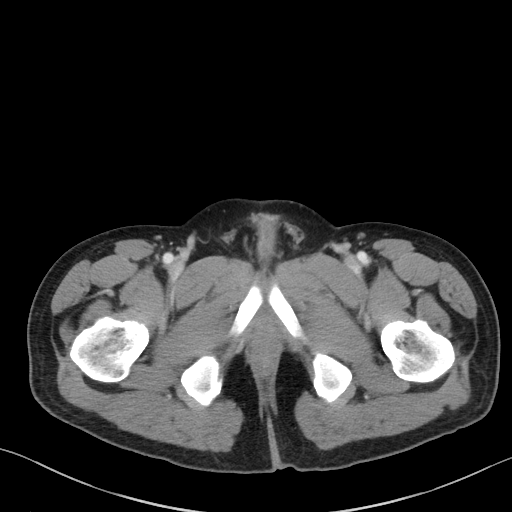
[im 6/97  bone]
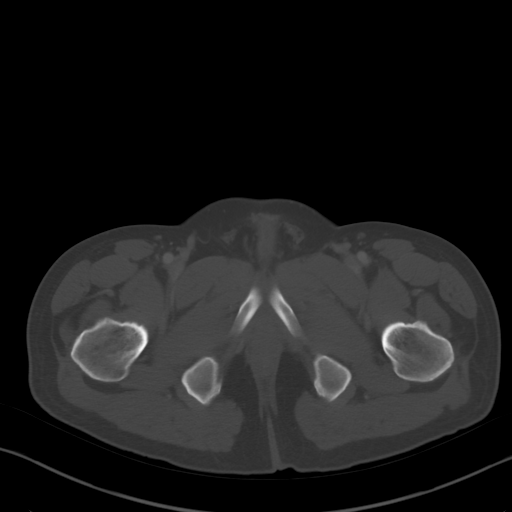
[im 17/97  soft-tissue]
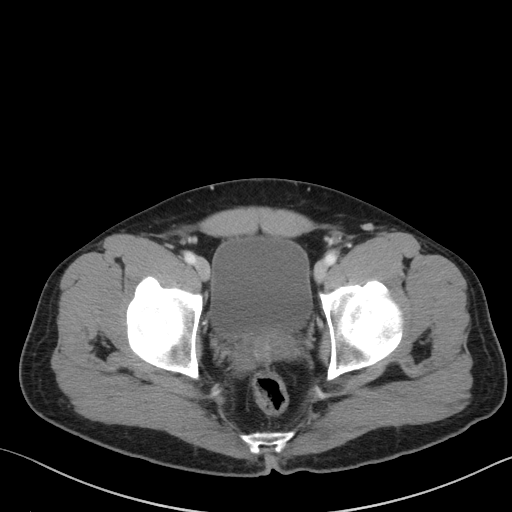
[im 22/97  soft-tissue]
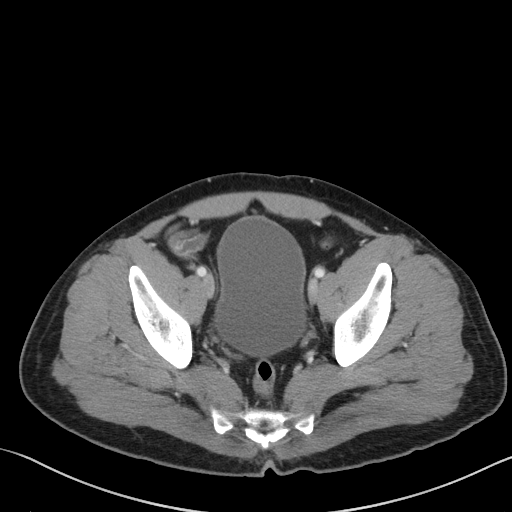
[im 27/97  soft-tissue]
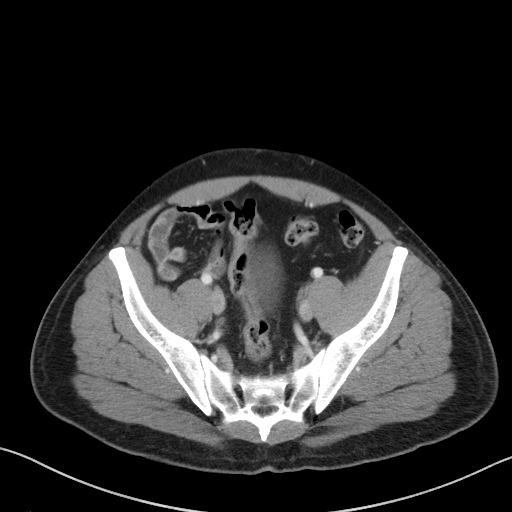
[im 38/97  soft-tissue]
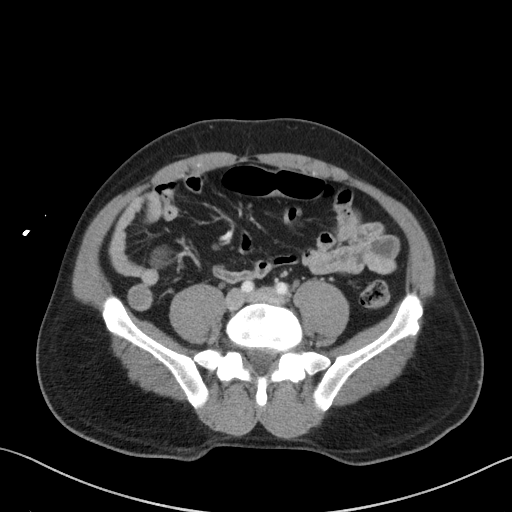
[im 43/97  soft-tissue]
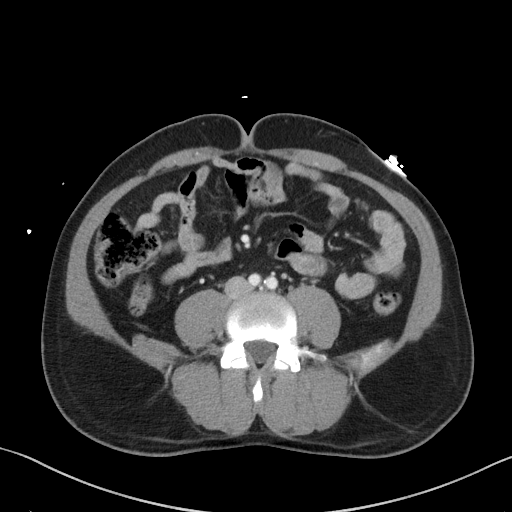
[im 54/97  soft-tissue]
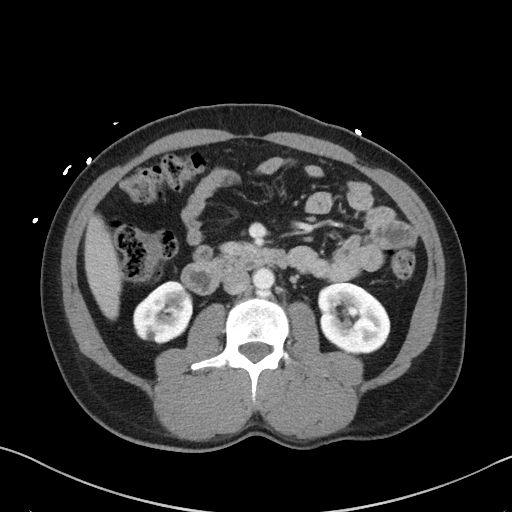
[im 59/97  soft-tissue]
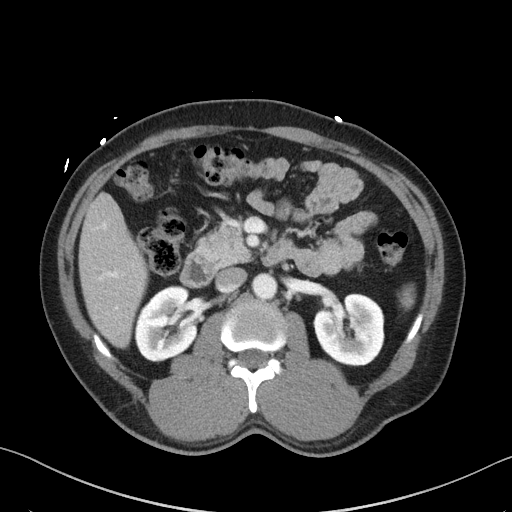
[im 70/97  soft-tissue]
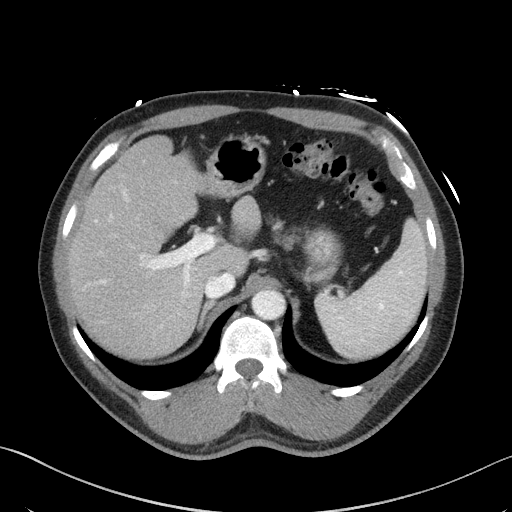
[im 70/97  bone]
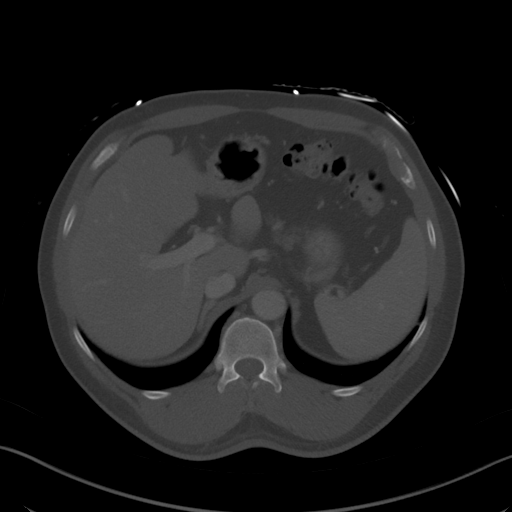
[im 75/97  soft-tissue]
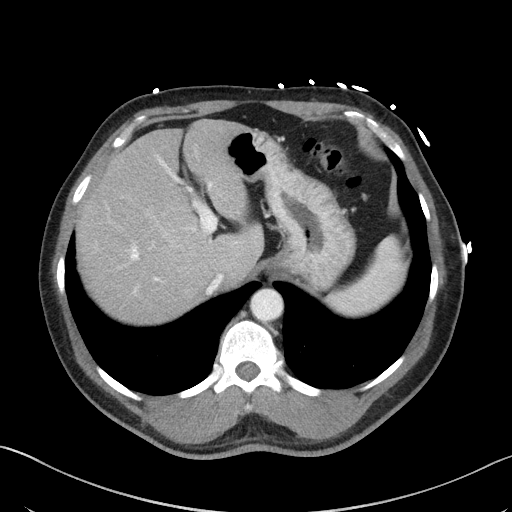
[im 81/97  soft-tissue]
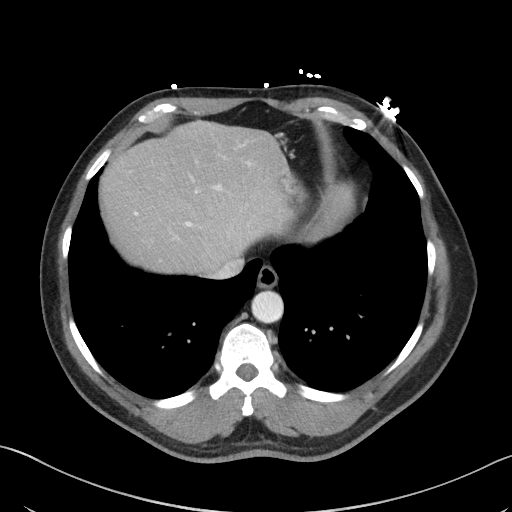
[im 91/97  soft-tissue]
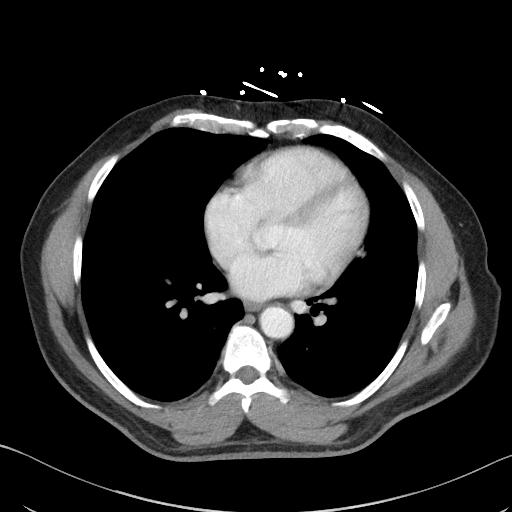

[Series 5: a/p w/ cor · coronal · 0.94mm/px · 3 of 127 slices shown]
[im 43/127  soft-tissue]
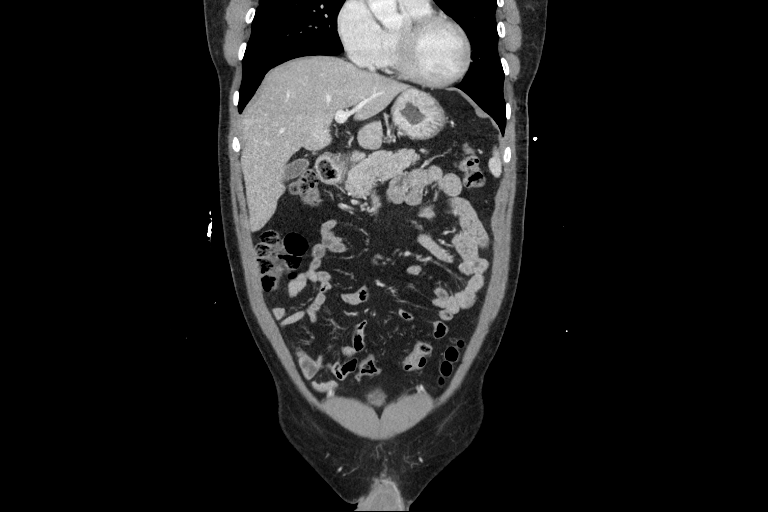
[im 57/127  soft-tissue]
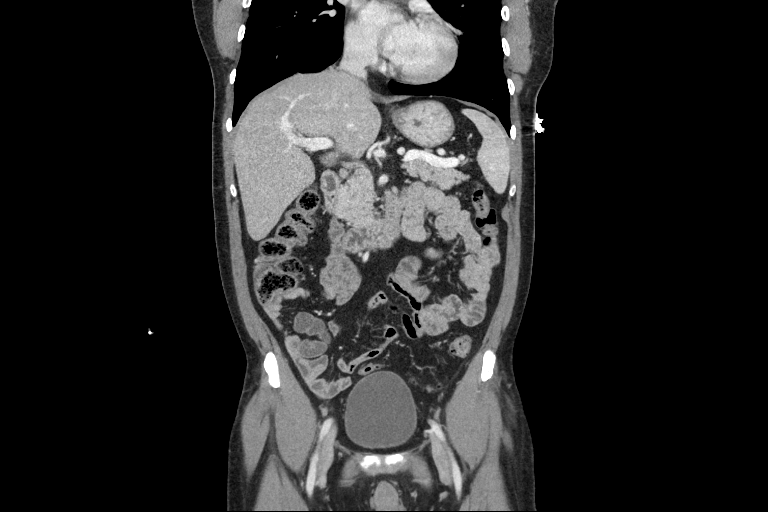
[im 71/127  soft-tissue]
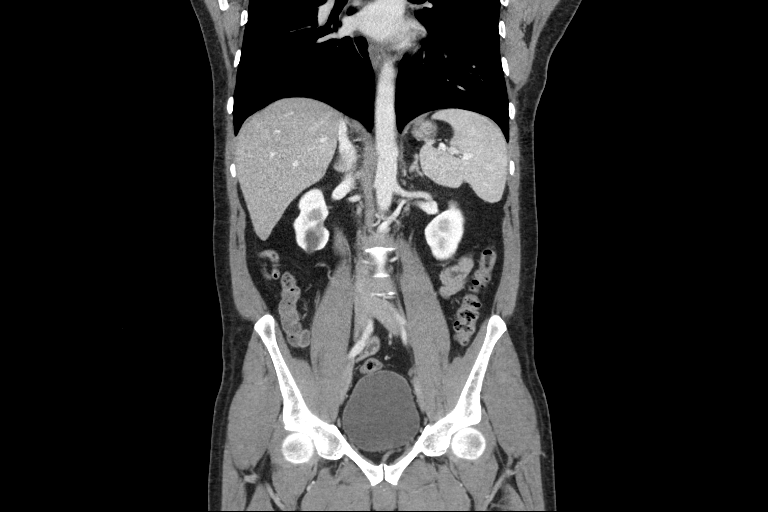

[15 of 46 positions shown; findings below may reference images not displayed]

FINDINGS: Lower chest: There is minimal scarring in the inferior lateral left
base. Lung bases otherwise are clear.

Hepatobiliary: There is a degree of hepatic steatosis. No focal
liver lesions are identified gallbladder wall is not appreciably
thickened. There is no biliary duct dilatation.

Pancreas: No pancreatic lesions are evident.

Spleen: No splenic lesions are evident.

Adrenals/Urinary Tract: Adrenals appear unremarkable. There are
scattered areas of renal scarring on the right. There is a cyst in
the lower pole the right kidney anteriorly measuring 1.4 x 1.3 cm.
There is a cyst nearby in the lower pole right kidney measuring
x 0.8 cm. There are two adjacent cysts in the left frontal region
measuring 0.8 x 0.7 cm and 1.1 x 1.0 cm respectively. There is mild
scarring in the lateral left kidney region. There is no
hydronephrosis on either side. There is no renal or ureteral
calculus on either side. Urinary bladder is midline with wall
thickness within normal limits.

Stomach/Bowel: There is no bowel wall or mesenteric thickening. No
bowel obstruction. No free air or portal venous air.

Vascular/Lymphatic: There are foci of atherosclerotic calcification
in the distal aorta and proximal common iliac arteries. No abdominal
aortic aneurysm is evident. The major mesenteric vessels appear
patent. A circumaortic left renal vein is noted, an anatomic
variant.

Reproductive: Prostate and seminal vesicles appear unremarkable.
There is no pelvic mass or pelvic fluid collection.

Other: The appendix appears normal. There is no ascites or abscess
in the abdomen or pelvis.

Musculoskeletal: There is disc degeneration at L5-S1. There are no
blastic or lytic bone lesions. No intramuscular or abdominal wall
lesion.
IMPRESSION: No bowel obstruction or bowel wall thickening. No abscess. Appendix
appears normal.

No renal or ureteral calculus.  No hydronephrosis.

There is a degree of hepatic steatosis without focal liver lesion
evident.

## 2018-09-21 ENCOUNTER — Ambulatory Visit (HOSPITAL_COMMUNITY)
Admission: EM | Admit: 2018-09-21 | Discharge: 2018-09-21 | Disposition: A | Discharge status: Home / Self Care | Payer: Medicaid Other | Attending: Family Medicine | Admitting: Family Medicine

## 2018-09-21 ENCOUNTER — Encounter (HOSPITAL_COMMUNITY): Payer: Self-pay | Admitting: Emergency Medicine

## 2018-09-21 DIAGNOSIS — B349 Viral infection, unspecified: Secondary | ICD-10-CM

## 2018-09-21 MED ORDER — IBUPROFEN 600 MG PO TABS: 600.0000 mg | ORAL_TABLET | Freq: Four times a day (QID) | ORAL | 0 refills | Status: DC | PRN

## 2018-09-21 NOTE — ED Triage Notes (Signed)
Pt c/o fever, headache, back pain, difficulty sleeping. Since last night. Has not taken any medicine. Pt states his fever was 104.

## 2018-09-21 NOTE — Discharge Instructions (Addendum)
I believe this is a viral illness You can take ibuprofen 600 mg every 6 hours as needed for body aches and fever Follow up as needed for continued or worsening symptoms

## 2018-09-21 NOTE — ED Provider Notes (Signed)
[facility]    CSN: [id number] Arrival date & time: 09/21/18  1042     History   Chief Complaint Chief Complaint  Patient presents with  . Fever  . Headache    HPI Priscille KluverOsman Simmering is a 42 y.o. male.   Patient is a 42 year old male presents for fever, headache, body aches that started last night.  Fever reported at home was 104.  His symptoms have since somewhat improved this morning.  He is afebrile this morning but continues with body aches.  He has not taken anything for his symptoms.  He denies any recent traveling, recent sick contacts.  He denies any urinary or URI symptoms.  He denies any nausea, vomiting or diarrhea.  ROS per HPI      Past Medical History:  Diagnosis Date  . Diabetes mellitus without complication (HCC)   . Hypertension     There are no active problems to display for this patient.   History reviewed. No pertinent surgical history.     Home Medications    Prior to Admission medications   Medication Sig Start Date End Date Taking? Authorizing Provider  amLODipine (NORVASC) 10 MG tablet Take 10 mg by mouth daily. 01/25/16   [provider]  atorvastatin (LIPITOR) 10 MG tablet Take 10 mg by mouth daily. 02/24/16   [provider]  cyclobenzaprine (FLEXERIL) 10 MG tablet Take 10 mg by mouth at bedtime. 03/27/16   [provider]  ibuprofen (ADVIL,MOTRIN) 600 MG tablet Take 1 tablet (600 mg total) by mouth every 6 (six) hours as needed. 09/21/18   Dahlia ByesBast, Liora Myles A, NP  lisinopril (PRINIVIL,ZESTRIL) 10 MG tablet Take 10 mg by mouth daily. 01/09/16   [provider]  metFORMIN (GLUCOPHAGE) 500 MG tablet Take 500 mg by mouth 2 (two) times daily. 01/09/16   [provider]  ondansetron (ZOFRAN) 4 MG tablet Take 4 mg by mouth every 8 (eight) hours as needed. nausea 03/27/16   [provider]  pantoprazole (PROTONIX) 40 MG tablet Take 1 tablet (40 mg total) by mouth daily. 10/16/17   Dione BoozeGlick, David, MD    sucralfate (CARAFATE) 1 g tablet Take 1 tablet (1 g total) by mouth 4 (four) times daily -  with meals and at bedtime. 10/16/17   Dione BoozeGlick, David, MD  traMADol (ULTRAM) 50 MG tablet Take 50 mg by mouth every 6 (six) hours as needed. pain 01/20/16   [provider]    Family History No family history on file.  Social History Social History   Tobacco Use  . Smoking status: Never Smoker  . Smokeless tobacco: Never Used  Substance Use Topics  . Alcohol use: No  . Drug use: No     Allergies   Patient has no known allergies.   Review of Systems Review of Systems   Physical Exam Triage Vital Signs ED Triage Vitals  Enc Vitals Group     BP 09/21/18 1056 132/83     Pulse Rate 09/21/18 1056 67     Resp 09/21/18 1056 16     Temp 09/21/18 1056 98.1 F (36.7 C)     Temp src --      SpO2 09/21/18 1056 100 %     Weight --      Height --      Head Circumference --      Peak Flow --      Pain Score 09/21/18 1057 5     Pain Loc --  Pain Edu? --      Excl. in GC? --    No data found.  Updated Vital Signs BP 132/83   Pulse 67   Temp 98.1 F (36.7 C)   Resp 16   SpO2 100%   Visual Acuity Right Eye Distance:   Left Eye Distance:   Bilateral Distance:    Right Eye Near:   Left Eye Near:    Bilateral Near:     Physical Exam  Constitutional: He appears well-developed and well-nourished.  Non-toxic appearance. He does not appear ill.  HENT:  Head: Normocephalic and atraumatic.  Mouth/Throat: Oropharynx is clear and moist.  Posterior oropharynx normal without tonsillar swelling, exudates.  Uvula midline.  Neck: Normal range of motion. Neck supple.  Cardiovascular: Normal rate, regular rhythm and normal heart sounds.  Pulmonary/Chest: Effort normal and breath sounds normal. No respiratory distress. He has no wheezes.  Abdominal: Soft.  Musculoskeletal: Normal range of motion. He exhibits tenderness. He exhibits no edema.  Tenderness to palpation of lower  lumbar spine.  No swelling, erythema, bruising.  Lymphadenopathy:    He has no cervical adenopathy.  Neurological: He is alert.  Skin: Skin is warm and dry.  Psychiatric: He has a normal mood and affect.  Nursing note and vitals reviewed.    UC Treatments / Results  Labs (all labs ordered are listed, but only abnormal results are displayed) Labs Reviewed - No data to display  EKG None  Radiology No results found.  Procedures Procedures (including critical care time)  Medications Ordered in UC Medications - No data to display  Initial Impression / Assessment and Plan / UC Course  I have reviewed the triage vital signs and the nursing notes.  Pertinent labs & imaging results that were available during my care of the patient were reviewed by me and considered in my medical decision making (see chart for details).     Most likely a viral illness.  Not convinced this is flu.  No other concerning signs or symptoms on exam.  Patient is afebrile today in clinic without medication.  Vital signs stable patient nontoxic or ill-appearing. Exam normal  Ibuprofen for pain and fever every 6 hours as needed Follow up as needed for continued or worsening symptoms  Final Clinical Impressions(s) / UC Diagnoses   Final diagnoses:  Viral illness     Discharge Instructions     I believe this is a viral illness You can take ibuprofen 600 mg every 6 hours as needed for body aches and fever Follow up as needed for continued or worsening symptoms     ED Prescriptions    Medication Sig Dispense Auth. Provider   ibuprofen (ADVIL,MOTRIN) 600 MG tablet Take 1 tablet (600 mg total) by mouth every 6 (six) hours as needed. 30 tablet Dahlia Byes A, NP     Controlled Substance Prescriptions [place] Controlled Substance Registry consulted? Not Applicable   Janace Aris, NP 09/21/18 1128

## 2018-12-22 ENCOUNTER — Other Ambulatory Visit: Payer: Self-pay

## 2018-12-22 ENCOUNTER — Ambulatory Visit (HOSPITAL_COMMUNITY)
Admission: EM | Admit: 2018-12-22 | Discharge: 2018-12-22 | Disposition: A | Discharge status: Home / Self Care | Payer: Medicaid Other | Attending: Family Medicine | Admitting: Family Medicine

## 2018-12-22 ENCOUNTER — Encounter (HOSPITAL_COMMUNITY): Payer: Self-pay | Admitting: Emergency Medicine

## 2018-12-22 DIAGNOSIS — R1013 Epigastric pain: Secondary | ICD-10-CM

## 2018-12-22 MED ORDER — DEXLANSOPRAZOLE 30 MG PO CPDR: 30.0000 mg | DELAYED_RELEASE_CAPSULE | Freq: Every day | ORAL | 0 refills | Status: DC

## 2018-12-22 NOTE — Discharge Instructions (Addendum)
Take medicine 2 x a day for a week then reduce to once a day Take a dose of mylanta at bedtime until symptoms improve See your PCP in follow up

## 2018-12-22 NOTE — ED Provider Notes (Signed)
[facility]    CSN: [id number] Arrival date & time: 12/22/18  1729     History   Chief Complaint Chief Complaint  Patient presents with  . Abdominal Pain    HPI Kenneth Diaz is a 43 y.o. male.   HPI   Patient states he was diagnosed 2 years ago with a "stomach ulcer".  He has been on Protonix since that time.  Initially he was on Carafate but he quit taking this.  He states that he brought in out of Protonix, and decided that he did not need it anymore about 6 days ago.  For the last 2 days he has had abdominal pain, left upper quadrant, nausea.  States that the pain waxes and wanes.  Is worse with certain foods.  It is worse if he takes ibuprofen.  He does not drink alcohol.  He did vomit.  No blood in emesis.  No change in bowels.  Past Medical History:  Diagnosis Date  . Diabetes mellitus without complication (HCC)   . Hypertension     There are no active problems to display for this patient.   History reviewed. No pertinent surgical history.     Home Medications    Prior to Admission medications   Medication Sig Start Date End Date Taking? Authorizing Provider  amLODipine (NORVASC) 10 MG tablet Take 10 mg by mouth daily. 01/25/16   [provider]  atorvastatin (LIPITOR) 10 MG tablet Take 10 mg by mouth daily. 02/24/16   [provider]  cyclobenzaprine (FLEXERIL) 10 MG tablet Take 10 mg by mouth at bedtime. 03/27/16   [provider]  Dexlansoprazole (DEXILANT) 30 MG capsule Take 1 capsule (30 mg total) by mouth daily. 12/22/18   Eustace Moore, MD  lisinopril (PRINIVIL,ZESTRIL) 10 MG tablet Take 10 mg by mouth daily. 01/09/16   [provider]  metFORMIN (GLUCOPHAGE) 500 MG tablet Take 500 mg by mouth 2 (two) times daily. 01/09/16   [provider]  ondansetron (ZOFRAN) 4 MG tablet Take 4 mg by mouth every 8 (eight) hours as needed. nausea 03/27/16   [provider]  traMADol (ULTRAM) 50 MG tablet Take  50 mg by mouth every 6 (six) hours as needed. pain 01/20/16   [provider]    Family History Family History  Problem Relation Age of Onset  . Diabetes Mother   . Hypertension Mother     Social History Social History   Tobacco Use  . Smoking status: Never Smoker  . Smokeless tobacco: Never Used  Substance Use Topics  . Alcohol use: No  . Drug use: No     Allergies   Patient has no known allergies.   Review of Systems Review of Systems  Constitutional: Negative for chills and fever.  HENT: Negative for ear pain and sore throat.   Eyes: Negative for pain and visual disturbance.  Respiratory: Negative for cough and shortness of breath.   Cardiovascular: Negative for chest pain and palpitations.  Gastrointestinal: Positive for abdominal pain, nausea and vomiting.  Genitourinary: Negative for dysuria and hematuria.  Musculoskeletal: Negative for arthralgias and back pain.  Skin: Negative for color change and rash.  Neurological: Negative for seizures and syncope.  All other systems reviewed and are negative.    Physical Exam Triage Vital Signs ED Triage Vitals  Enc Vitals Group     BP 12/22/18 1807 (!) 145/83     Pulse Rate 12/22/18 1807 71     Resp 12/22/18  1807 16     Temp 12/22/18 1807 98 F (36.7 C)     Temp Source 12/22/18 1807 Oral     SpO2 12/22/18 1807 98 %     Weight --      Height --      Head Circumference --      Peak Flow --      Pain Score 12/22/18 1804 6     Pain Loc --      Pain Edu? --      Excl. in GC? --    No data found.  Updated Vital Signs BP (!) 145/83 (BP Location: Right Arm)   Pulse 71   Temp 98 F (36.7 C) (Oral)   Resp 16   SpO2 98%   Visual Acuity Right Eye Distance:   Left Eye Distance:   Bilateral Distance:    Right Eye Near:   Left Eye Near:    Bilateral Near:     Physical Exam Constitutional:      General: He is not in acute distress.    Appearance: He is well-developed.  HENT:     Head:  Normocephalic and atraumatic.  Eyes:     Conjunctiva/sclera: Conjunctivae normal.     Pupils: Pupils are equal, round, and reactive to light.  Neck:     Musculoskeletal: Normal range of motion.  Cardiovascular:     Rate and Rhythm: Normal rate and regular rhythm.     Heart sounds: Normal heart sounds.  Pulmonary:     Effort: Pulmonary effort is normal. No respiratory distress.     Breath sounds: Normal breath sounds.  Abdominal:     General: Abdomen is flat. Bowel sounds are increased. There is no distension.     Palpations: Abdomen is soft.     Tenderness: There is abdominal tenderness in the epigastric area and left upper quadrant. There is no guarding or rebound.  Musculoskeletal: Normal range of motion.  Skin:    General: Skin is warm and dry.  Neurological:     Mental Status: He is alert.      UC Treatments / Results  Labs (all labs ordered are listed, but only abnormal results are displayed) Labs Reviewed - No data to display  EKG None  Radiology No results found.  Procedures Procedures (including critical care time)  Medications Ordered in UC Medications - No data to display  Initial Impression / Assessment and Plan / UC Course  I have reviewed the triage vital signs and the nursing notes.  Pertinent labs & imaging results that were available during my care of the patient were reviewed by me and considered in my medical decision making (see chart for details).     Patient does not want to take pantoprazole.  I am going to try him and start on Dexilant.  He is going to take 2 a day at first and then go down to 1 a day.  Discussed importance of follow-up with his PCP. Final Clinical Impressions(s) / UC Diagnoses   Final diagnoses:  Dyspepsia     Discharge Instructions     Take medicine 2 x a day for a week then reduce to once a day Take a dose of mylanta at bedtime until symptoms improve See your PCP in follow up   ED Prescriptions    Medication  Sig Dispense Auth. Provider   Dexlansoprazole (DEXILANT) 30 MG capsule Take 1 capsule (30 mg total) by mouth daily. 60 capsule Eustace MooreNelson, Asuzena Weis Sue, MD  Controlled Substance Prescriptions [place] Controlled Substance Registry consulted? Not Applicable   Eustace MooreNelson, Shady Padron Sue, MD 12/22/18 2033

## 2018-12-22 NOTE — ED Triage Notes (Addendum)
Abdominal pain for 2 days.  Patient reports vomiting 2 times yesterday.  Denies diarrhea.  Out of pantoprazole for one week

## 2019-07-12 ENCOUNTER — Ambulatory Visit (HOSPITAL_COMMUNITY)
Admission: EM | Admit: 2019-07-12 | Discharge: 2019-07-12 | Disposition: A | Discharge status: Home / Self Care | Payer: Medicaid Other | Attending: Family Medicine | Admitting: Family Medicine

## 2019-07-12 ENCOUNTER — Encounter (HOSPITAL_COMMUNITY): Payer: Self-pay

## 2019-07-12 ENCOUNTER — Other Ambulatory Visit: Payer: Self-pay

## 2019-07-12 DIAGNOSIS — R1012 Left upper quadrant pain: Secondary | ICD-10-CM | POA: Insufficient documentation

## 2019-07-12 LAB — CBC WITH DIFFERENTIAL/PLATELET
Abs Immature Granulocytes: 0.03 10*3/uL (ref 0.00–0.07)
Basophils Absolute: 0 10*3/uL (ref 0.0–0.1)
Basophils Relative: 0 %
Eosinophils Absolute: 0.3 10*3/uL (ref 0.0–0.5)
Eosinophils Relative: 3 %
HCT: 48.3 % (ref 39.0–52.0)
Hemoglobin: 16.5 g/dL (ref 13.0–17.0)
Immature Granulocytes: 0 %
Lymphocytes Relative: 26 %
Lymphs Abs: 2.4 10*3/uL (ref 0.7–4.0)
MCH: 29.4 pg (ref 26.0–34.0)
MCHC: 34.2 g/dL (ref 30.0–36.0)
MCV: 85.9 fL (ref 80.0–100.0)
Monocytes Absolute: 0.8 10*3/uL (ref 0.1–1.0)
Monocytes Relative: 8 %
Neutro Abs: 5.8 10*3/uL (ref 1.7–7.7)
Neutrophils Relative %: 63 %
Platelets: 295 10*3/uL (ref 150–400)
RBC: 5.62 MIL/uL (ref 4.22–5.81)
RDW: 12.2 % (ref 11.5–15.5)
WBC: 9.3 10*3/uL (ref 4.0–10.5)
nRBC: 0 % (ref 0.0–0.2)

## 2019-07-12 LAB — COMPREHENSIVE METABOLIC PANEL
ALT: 14 U/L (ref 0–44)
AST: 19 U/L (ref 15–41)
Albumin: 3.9 g/dL (ref 3.5–5.0)
Alkaline Phosphatase: 75 U/L (ref 38–126)
Anion gap: 11 (ref 5–15)
BUN: 23 mg/dL — ABNORMAL HIGH (ref 6–20)
CO2: 29 mmol/L (ref 22–32)
Calcium: 9.4 mg/dL (ref 8.9–10.3)
Chloride: 99 mmol/L (ref 98–111)
Creatinine, Ser: 1.49 mg/dL — ABNORMAL HIGH (ref 0.61–1.24)
GFR calc Af Amer: >60 mL/min (ref >60)
GFR calc non Af Amer: 57 mL/min — ABNORMAL LOW (ref >60)
Glucose, Bld: 95 mg/dL (ref 70–99)
Potassium: 4.4 mmol/L (ref 3.5–5.1)
Sodium: 139 mmol/L (ref 135–145)
Total Bilirubin: 0.6 mg/dL (ref 0.3–1.2)
Total Protein: 7.4 g/dL (ref 6.5–8.1)

## 2019-07-12 LAB — LIPASE, BLOOD: Lipase: 40 U/L (ref 11–51)

## 2019-07-12 MED ORDER — ALUM & MAG HYDROXIDE-SIMETH 200-200-20 MG/5ML PO SUSP
30.0000 mL | Freq: Once | ORAL | Status: AC
  Administered 2019-07-12: 30 mL via ORAL

## 2019-07-12 MED ORDER — LIDOCAINE VISCOUS HCL 2 % MT SOLN
OROMUCOSAL | Status: AC
  Filled 2019-07-12: qty 15

## 2019-07-12 MED ORDER — ALUM & MAG HYDROXIDE-SIMETH 200-200-20 MG/5ML PO SUSP
ORAL | Status: AC
  Filled 2019-07-12: qty 30

## 2019-07-12 MED ORDER — LIDOCAINE VISCOUS HCL 2 % MT SOLN
15.0000 mL | Freq: Once | OROMUCOSAL | Status: AC
  Administered 2019-07-12: 15 mL via ORAL

## 2019-07-12 MED ORDER — SUCRALFATE 1 G PO TABS: 1.0000 g | ORAL_TABLET | Freq: Three times a day (TID) | ORAL | 0 refills | Status: DC

## 2019-07-12 NOTE — ED Triage Notes (Signed)
Patient presents to Urgent Care with complaints of left sided abdominal pain since 4 days ago. Patient reports he has not taken medicine for it.

## 2019-07-12 NOTE — Discharge Instructions (Signed)
I will recheck labs to compare from Monday since your pain increased, as well as add an additional lab test as well. Will notify you of any concerning findings.  May restart Carafate. Continue with daily protonix as prescribed. I would recommend reaching out to your primary care provider again as may need further evaluation, and to ensure your referral is in process.  Any worsening of pain, fevers, dehydration, or otherwise worsening please go to the ER.  I do recommend a low fat diet.

## 2019-07-12 NOTE — ED Provider Notes (Signed)
[employer]    CSN: [id number] Arrival date & time: 07/12/19  1834      History   Chief Complaint Chief Complaint  Patient presents with  . Abdominal Pain    HPI Kenneth Diaz is a 43 y.o. male.   Kenneth Diaz presents with complaints of LUQ abdominal pain has been for the past few days. History of h. Pylori and epigastric pain, but feels this is more offset to the left. No nausea or vomiting. Earlier tonight pain was severe, 10/10. Has improved some, 8/10 now. Has had some constipation as well. Decreased appetite. No fever.  Was prescribed protonix. Has been seeing his PCP via video visits with labs and referral placed. Labs on Monday were WNL. No h. Pylori results, however. Gi referral still pending. Denies  NSAID or ETOH use. No blood in stool. Eating worsens pain but so does having an empty stomach. Has used carafate in the past which helped, does not have this currently. History  Of dm, htn.    ROS per HPI, negative if not otherwise mentioned.      Past Medical History:  Diagnosis Date  . Diabetes mellitus without complication ([place])   . Hypertension     There are no active problems to display for this patient.   History reviewed. No pertinent surgical history.     Home Medications    Prior to Admission medications   Medication Sig Start Date End Date Taking? Authorizing Provider  metFORMIN (GLUCOPHAGE) 500 MG tablet Take 500 mg by mouth 2 (two) times daily. 01/09/16  Yes [provider]  amLODipine (NORVASC) 10 MG tablet Take 10 mg by mouth daily. 01/25/16   [provider]  atorvastatin (LIPITOR) 10 MG tablet Take 10 mg by mouth daily. 02/24/16   [provider]  cyclobenzaprine (FLEXERIL) 10 MG tablet Take 10 mg by mouth at bedtime. 03/27/16   [provider]  Dexlansoprazole (DEXILANT) 30 MG capsule Take 1 capsule (30 mg total) by mouth daily. 12/22/18   Raylene Everts, MD  lisinopril (PRINIVIL,ZESTRIL) 10 MG  tablet Take 10 mg by mouth daily. 01/09/16   [provider]  ondansetron (ZOFRAN) 4 MG tablet Take 4 mg by mouth every 8 (eight) hours as needed. nausea 03/27/16   [provider]  sucralfate (CARAFATE) 1 g tablet Take 1 tablet (1 g total) by mouth 4 (four) times daily -  with meals and at bedtime. 07/12/19 08/11/19  Zigmund Gottron, NP  traMADol (ULTRAM) 50 MG tablet Take 50 mg by mouth every 6 (six) hours as needed. pain 01/20/16   [provider]    Family History Family History  Problem Relation Age of Onset  . Diabetes Mother   . Hypertension Mother     Social History Social History   Tobacco Use  . Smoking status: Never Smoker  . Smokeless tobacco: Never Used  Substance Use Topics  . Alcohol use: No  . Drug use: No     Allergies   Patient has no known allergies.   Review of Systems Review of Systems   Physical Exam Triage Vital Signs ED Triage Vitals  Enc Vitals Group     BP 07/12/19 1923 (!) 152/95     Pulse Rate 07/12/19 1923 67     Resp 07/12/19 1923 15     Temp 07/12/19 1923 98 F (36.7 C)     Temp Source 07/12/19 1923 Oral     SpO2 07/12/19 1923 100 %  Weight --      Height --      Head Circumference --      Peak Flow --      Pain Score 07/12/19 1920 6     Pain Loc --      Pain Edu? --      Excl. in GC? --    No data found.  Updated Vital Signs BP (!) 152/95 (BP Location: Right Arm)   Pulse 67   Temp 98 F (36.7 C) (Oral)   Resp 15   SpO2 100%   Visual Acuity Right Eye Distance:   Left Eye Distance:   Bilateral Distance:    Right Eye Near:   Left Eye Near:    Bilateral Near:     Physical Exam Constitutional:      Appearance: He is well-developed.  Cardiovascular:     Rate and Rhythm: Normal rate and regular rhythm.  Pulmonary:     Effort: Pulmonary effort is normal.     Breath sounds: Normal breath sounds.  Abdominal:     Palpations: Abdomen is soft.     Tenderness: There is abdominal tenderness in  the epigastric area and left upper quadrant. There is no right CVA tenderness, left CVA tenderness, guarding or rebound.     Comments: Mild LUQ abdominal pain   Skin:    General: Skin is warm and dry.  Neurological:     Mental Status: He is alert and oriented to person, place, and time.      UC Treatments / Results  Labs (all labs ordered are listed, but only abnormal results are displayed) Labs Reviewed  CBC WITH DIFFERENTIAL/PLATELET  COMPREHENSIVE METABOLIC PANEL  LIPASE, BLOOD    EKG   Radiology No results found.  Procedures Procedures (including critical care time)  Medications Ordered in UC Medications  alum & mag hydroxide-simeth (MAALOX/MYLANTA) 200-200-20 MG/5ML suspension 30 mL (30 mLs Oral Given 07/12/19 2016)    And  lidocaine (XYLOCAINE) 2 % viscous mouth solution 15 mL (15 mLs Oral Given 07/12/19 2016)  alum & mag hydroxide-simeth (MAALOX/MYLANTA) 200-200-20 MG/5ML suspension (has no administration in time range)  lidocaine (XYLOCAINE) 2 % viscous mouth solution (has no administration in time range)    Initial Impression / Assessment and Plan / UC Course  I have reviewed the triage vital signs and the nursing notes.  Pertinent labs & imaging results that were available during my care of the patient were reviewed by me and considered in my medical decision making (see chart for details).     Non toxic. Afebrile. No tachycardia. Findings on exam are not consistent with acute abdomen. Labs reviewed from two days ago, and repeated today without significant changes or findings. Lipase WNL. H. Pylori testing still pending with PCP. Refilled carafate, encouraged continued use of prontonix. Continue to work with PCP for evaluation, ER precautions provided. Patient verbalized understanding and agreeable to plan.   Final Clinical Impressions(s) / UC Diagnoses   Final diagnoses:  LUQ pain     Discharge Instructions     I will recheck labs to compare from Monday  since your pain increased, as well as add an additional lab test as well. Will notify you of any concerning findings.  May restart Carafate. Continue with daily protonix as prescribed. I would recommend reaching out to your primary care provider again as may need further evaluation, and to ensure your referral is in process.  Any worsening of pain, fevers, dehydration, or otherwise worsening please  go to the ER.  I do recommend a low fat diet.    ED Prescriptions    Medication Sig Dispense Auth. Provider   sucralfate (CARAFATE) 1 g tablet Take 1 tablet (1 g total) by mouth 4 (four) times daily -  with meals and at bedtime. 120 tablet Georgetta HaberBurky, Terran Klinke B, NP     Controlled Substance Prescriptions  Controlled Substance Registry consulted? Not Applicable   Georgetta HaberBurky, Eziah Negro B, NP 07/13/19 [phone]

## 2019-07-13 ENCOUNTER — Emergency Department (HOSPITAL_COMMUNITY)
Admission: EM | Admit: 2019-07-13 | Discharge: 2019-07-14 | Disposition: A | Discharge status: Home / Self Care | Payer: Medicaid Other | Attending: Emergency Medicine | Admitting: Emergency Medicine

## 2019-07-13 ENCOUNTER — Other Ambulatory Visit: Payer: Self-pay

## 2019-07-13 DIAGNOSIS — E119 Type 2 diabetes mellitus without complications: Secondary | ICD-10-CM | POA: Diagnosis not present

## 2019-07-13 DIAGNOSIS — R1013 Epigastric pain: Secondary | ICD-10-CM | POA: Diagnosis present

## 2019-07-13 DIAGNOSIS — I10 Essential (primary) hypertension: Secondary | ICD-10-CM | POA: Insufficient documentation

## 2019-07-13 DIAGNOSIS — M79652 Pain in left thigh: Secondary | ICD-10-CM

## 2019-07-13 DIAGNOSIS — Z79899 Other long term (current) drug therapy: Secondary | ICD-10-CM | POA: Diagnosis not present

## 2019-07-13 LAB — URINALYSIS, ROUTINE W REFLEX MICROSCOPIC
Bilirubin Urine: NEGATIVE
Glucose, UA: NEGATIVE mg/dL
Hgb urine dipstick: NEGATIVE
Ketones, ur: NEGATIVE mg/dL
Leukocytes,Ua: NEGATIVE
Nitrite: NEGATIVE
Protein, ur: 100 mg/dL — AB
Specific Gravity, Urine: 1.015 (ref 1.005–1.030)
pH: 5 (ref 5.0–8.0)

## 2019-07-13 LAB — LIPASE, BLOOD: Lipase: 40 U/L (ref 11–51)

## 2019-07-13 LAB — COMPREHENSIVE METABOLIC PANEL
ALT: 18 U/L (ref 0–44)
AST: 16 U/L (ref 15–41)
Albumin: 3.9 g/dL (ref 3.5–5.0)
Alkaline Phosphatase: 84 U/L (ref 38–126)
Anion gap: 12 (ref 5–15)
BUN: 20 mg/dL (ref 6–20)
CO2: 25 mmol/L (ref 22–32)
Calcium: 9.3 mg/dL (ref 8.9–10.3)
Chloride: 99 mmol/L (ref 98–111)
Creatinine, Ser: 1.53 mg/dL — ABNORMAL HIGH (ref 0.61–1.24)
GFR calc Af Amer: >60 mL/min (ref >60)
GFR calc non Af Amer: 55 mL/min — ABNORMAL LOW (ref >60)
Glucose, Bld: 112 mg/dL — ABNORMAL HIGH (ref 70–99)
Potassium: 3.8 mmol/L (ref 3.5–5.1)
Sodium: 136 mmol/L (ref 135–145)
Total Bilirubin: 0.4 mg/dL (ref 0.3–1.2)
Total Protein: 7.8 g/dL (ref 6.5–8.1)

## 2019-07-13 LAB — CBC
HCT: 49.2 % (ref 39.0–52.0)
Hemoglobin: 16.5 g/dL (ref 13.0–17.0)
MCH: 28.9 pg (ref 26.0–34.0)
MCHC: 33.5 g/dL (ref 30.0–36.0)
MCV: 86.2 fL (ref 80.0–100.0)
Platelets: 273 10*3/uL (ref 150–400)
RBC: 5.71 MIL/uL (ref 4.22–5.81)
RDW: 12.2 % (ref 11.5–15.5)
WBC: 9.9 10*3/uL (ref 4.0–10.5)
nRBC: 0 % (ref 0.0–0.2)

## 2019-07-13 MED ORDER — SODIUM CHLORIDE 0.9% FLUSH: 3.0000 mL | Freq: Once | INTRAVENOUS | Status: DC

## 2019-07-13 NOTE — ED Triage Notes (Signed)
Per pt he has been having abdominal pain x 3 days no nausea or vomiting. Pain is in the upper left quadrant are. No fevers

## 2019-07-14 MED ORDER — ACETAMINOPHEN 325 MG PO TABS
650.0000 mg | ORAL_TABLET | Freq: Once | ORAL | Status: AC
  Administered 2019-07-14: 650 mg via ORAL
  Filled 2019-07-14: qty 2

## 2019-07-14 MED ORDER — LIDOCAINE VISCOUS HCL 2 % MT SOLN
15.0000 mL | Freq: Once | OROMUCOSAL | Status: AC
  Administered 2019-07-14: 04:00:00 15 mL via ORAL
  Filled 2019-07-14: qty 15

## 2019-07-14 MED ORDER — ALUM & MAG HYDROXIDE-SIMETH 200-200-20 MG/5ML PO SUSP
30.0000 mL | Freq: Once | ORAL | Status: AC
  Administered 2019-07-14: 30 mL via ORAL
  Filled 2019-07-14: qty 30

## 2019-07-14 MED ORDER — SUCRALFATE 1 G PO TABS: 1.0000 g | ORAL_TABLET | Freq: Three times a day (TID) | ORAL | 0 refills | Status: AC

## 2019-07-14 MED ORDER — OMEPRAZOLE 20 MG PO CPDR: 20.0000 mg | DELAYED_RELEASE_CAPSULE | Freq: Every day | ORAL | 0 refills | Status: AC

## 2019-07-14 NOTE — Discharge Instructions (Addendum)
Thank you for allowing me to care for you today in the Emergency Department.   Take 1 tablet of omeprazole by mouth daily for the next 2 weeks.  Take 1 tablet of Carafate every 6 hours by mouth. Maalox is available over-the-counter and may help with your stomach pain.  Use as directed on the label.  Call to schedule follow-up appointment with gastroenterology.  Apply a warm washcloth or heating pad to the left thigh to help with pain.  You can take Tylenol for pain.  Return to the emergency department if you develop black or bloody stools or vomiting, high fevers, or other new, concerning symptoms.

## 2019-07-14 NOTE — ED Provider Notes (Signed)
[facility]   CSN: [id number] Arrival date & time: 07/13/19  1730     History   Chief Complaint Chief Complaint  Patient presents with  . Abdominal Pain    HPI Kenneth Diaz is a 43 y.o. male with a history of H. pylori, diabetes mellitus and hypertension who presents to the emergency department with a chief complaint of epigastric and left upper quadrant pain.  The patient reports that he has been having intermittent epigastric pain for the last few days.  He states that pain was severe earlier tonight, but has slightly improved since onset.  He reports a history of similar, but reports that he has not had pain prior to this episode in several weeks.  He reports that previously his pain resolved by taking Carafate, but he stopped taking the medication when his pain improved.  He reports that he was seen at urgent care yesterday and was given "a medicine drink" and his pain resolved.  He denies diarrhea, constipation, fever, chills, hematemesis, nausea, vomiting, melena, hematochezia, shortness of breath, or chest pain.  Denies alcohol or NSAID use.  He had labs performed by his PCP after a video visit earlier this week.  He has a pending referral to gastroenterology.     The history is provided by the patient. No language interpreter was used.    Past Medical History:  Diagnosis Date  . Diabetes mellitus without complication (HCC)   . Hypertension     There are no active problems to display for this patient.   No past surgical history on file.      Home Medications    Prior to Admission medications   Medication Sig Start Date End Date Taking? Authorizing Provider  amLODipine (NORVASC) 10 MG tablet Take 10 mg by mouth daily. 01/25/16  Yes [provider]  atorvastatin (LIPITOR) 10 MG tablet Take 10 mg by mouth daily. 02/24/16  Yes [provider]  lisinopril (PRINIVIL,ZESTRIL) 10 MG tablet Take 10 mg by  mouth daily. 01/09/16  Yes [provider]  omeprazole (PRILOSEC) 20 MG capsule Take 1 capsule (20 mg total) by mouth daily for 14 days. 07/14/19 07/28/19  Dayanis Bergquist A, PA-C  sucralfate (CARAFATE) 1 g tablet Take 1 tablet (1 g total) by mouth 4 (four) times daily -  with meals and at bedtime. 07/14/19 08/13/19  Silena Wyss A, PA-C  Dexlansoprazole (DEXILANT) 30 MG capsule Take 1 capsule (30 mg total) by mouth daily. Patient not taking: Reported on 07/14/2019 12/22/18 07/14/19  Eustace MooreNelson, Yvonne Sue, MD    Family History Family History  Problem Relation Age of Onset  . Diabetes Mother   . Hypertension Mother     Social History Social History   Tobacco Use  . Smoking status: Never Smoker  . Smokeless tobacco: Never Used  Substance Use Topics  . Alcohol use: No  . Drug use: No     Allergies   Patient has no known allergies.   Review of Systems Review of Systems  Constitutional: Negative for appetite change, chills and fever.  Respiratory: Negative for shortness of breath.   Cardiovascular: Negative for chest pain.  Gastrointestinal: Positive for abdominal pain. Negative for anal bleeding, blood in stool, constipation, diarrhea, nausea and vomiting.  Genitourinary: Negative for dysuria, flank pain, frequency, hematuria, penile pain, penile swelling, scrotal swelling and urgency.  Musculoskeletal: Negative for back pain, myalgias, neck pain and neck stiffness.  Skin: Negative for rash.  Allergic/Immunologic: Negative  for immunocompromised state.  Neurological: Negative for dizziness, seizures, syncope, weakness and headaches.  Psychiatric/Behavioral: Negative for confusion.     Physical Exam Updated Vital Signs BP (!) 145/89   Pulse 65   Temp 98.3 F (36.8 C)   Resp 15   SpO2 100%   Physical Exam   ED Treatments / Results  Labs (all labs ordered are listed, but only abnormal results are displayed) Labs Reviewed  COMPREHENSIVE METABOLIC PANEL - Abnormal; Notable  for the following components:      Result Value   Glucose, Bld 112 (*)    Creatinine, Ser 1.53 (*)    GFR calc non Af Amer 55 (*)    All other components within normal limits  URINALYSIS, ROUTINE W REFLEX MICROSCOPIC - Abnormal; Notable for the following components:   Protein, ur 100 (*)    Bacteria, UA RARE (*)    All other components within normal limits  LIPASE, BLOOD  CBC    EKG None  Radiology No results found.  Procedures Procedures (including critical care time)  Medications Ordered in ED Medications  alum & mag hydroxide-simeth (MAALOX/MYLANTA) 200-200-20 MG/5ML suspension 30 mL (30 mLs Oral Given 07/14/19 0346)    And  lidocaine (XYLOCAINE) 2 % viscous mouth solution 15 mL (15 mLs Oral Given 07/14/19 0346)  acetaminophen (TYLENOL) tablet 650 mg (650 mg Oral Given 07/14/19 0346)     Initial Impression / Assessment and Plan / ED Course  I have reviewed the triage vital signs and the nursing notes.  Pertinent labs & imaging results that were available during my care of the patient were reviewed by me and considered in my medical decision making (see chart for details).        43 year old male with a history of H. pylori, diabetes mellitus and hypertension who presents to the ER for epigastric and left upper quadrant pain that is been intermittent for the last few days.  Per chart review, he has a longstanding history of dyspepsia and upper abdominal discomfort.  He previously was diagnosed with H. pylori, but was treated.  Abdominal exam is grossly unremarkable.  Labs have been reviewed and are unremarkable.  He was given a GI cocktail in the ER with some improvement in his symptoms.  Will discharge with omeprazole and Carafate.  He was encouraged to keep as GI follow-up after referral has been made.  Low suspicion for bleeding peptic ulcer, pancreatitis, choledocholithiasis, diverticulitis, or SBO.  He is hemodynamically stable in no acute distress.  Safe for discharge home  with outpatient follow-up.  Final Clinical Impressions(s) / ED Diagnoses   Final diagnoses:  Epigastric pain  Left thigh pain    ED Discharge Orders         Ordered    sucralfate (CARAFATE) 1 g tablet  3 times daily with meals & bedtime     07/14/19 0505    omeprazole (PRILOSEC) 20 MG capsule  Daily     07/14/19 0505           Marvyn Torrez A, PA-C 07/14/19 0843    Mesner, Corene Cornea, MD 07/15/19 0500

## 2019-09-09 IMAGING — DX DG ABDOMEN 2V
3 series · 3 of 3 positions shown · non-contrast
Comparison: 04/07/2016

CLINICAL DATA: Left-sided abdominal pain ir 2 days

EXAM:
ABDOMEN - 2 VIEW

[abdomen erect]
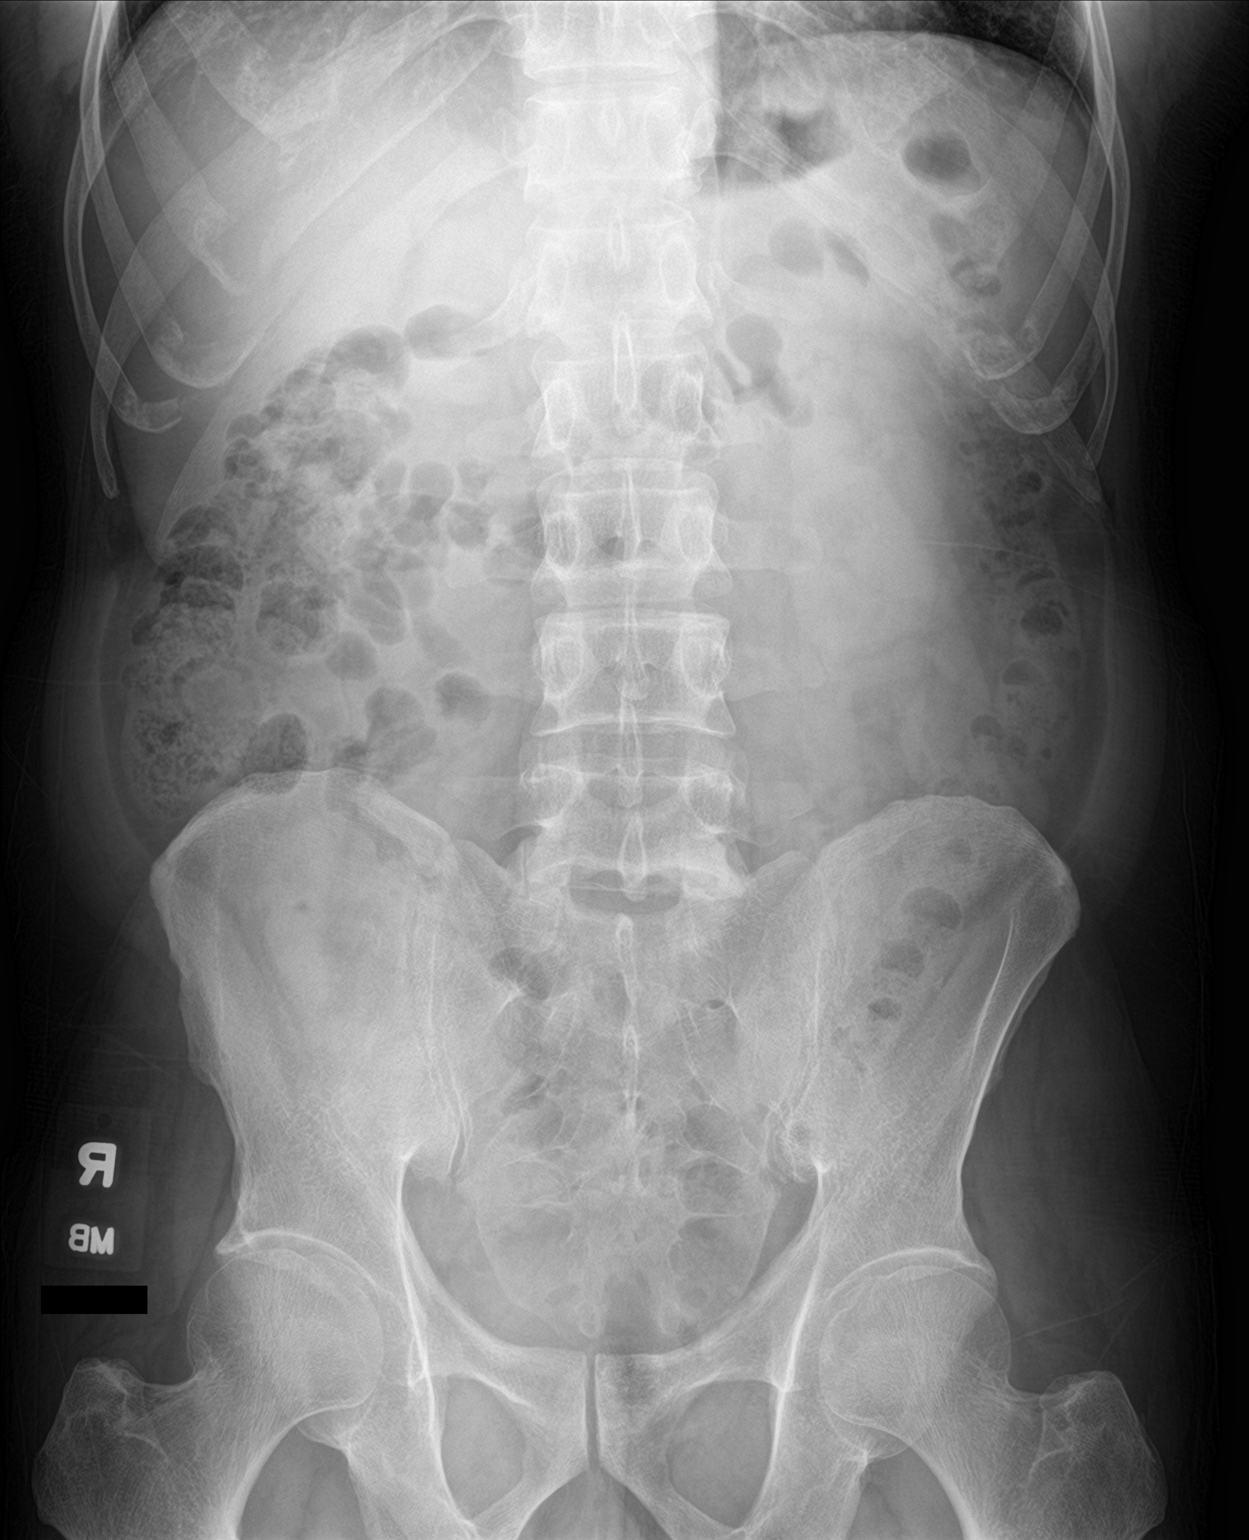

[abdomen supine (1 of 2)]
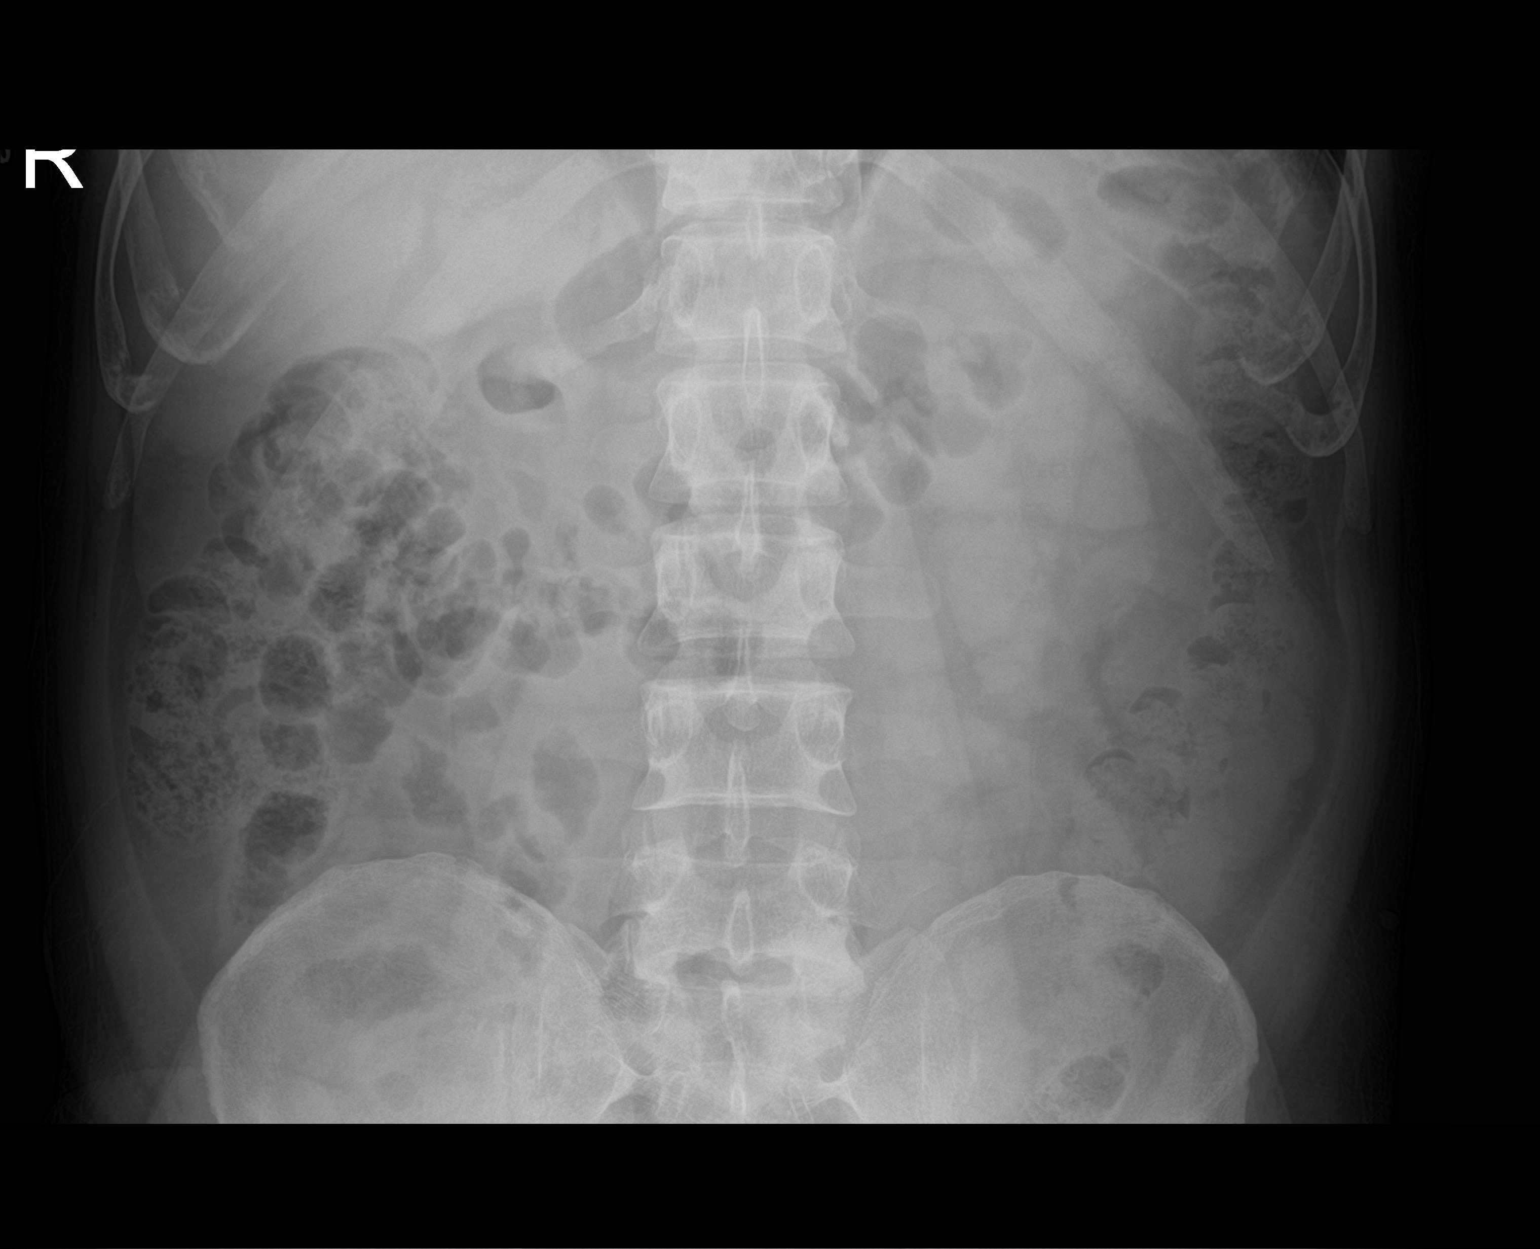

[abdomen supine (2 of 2)]
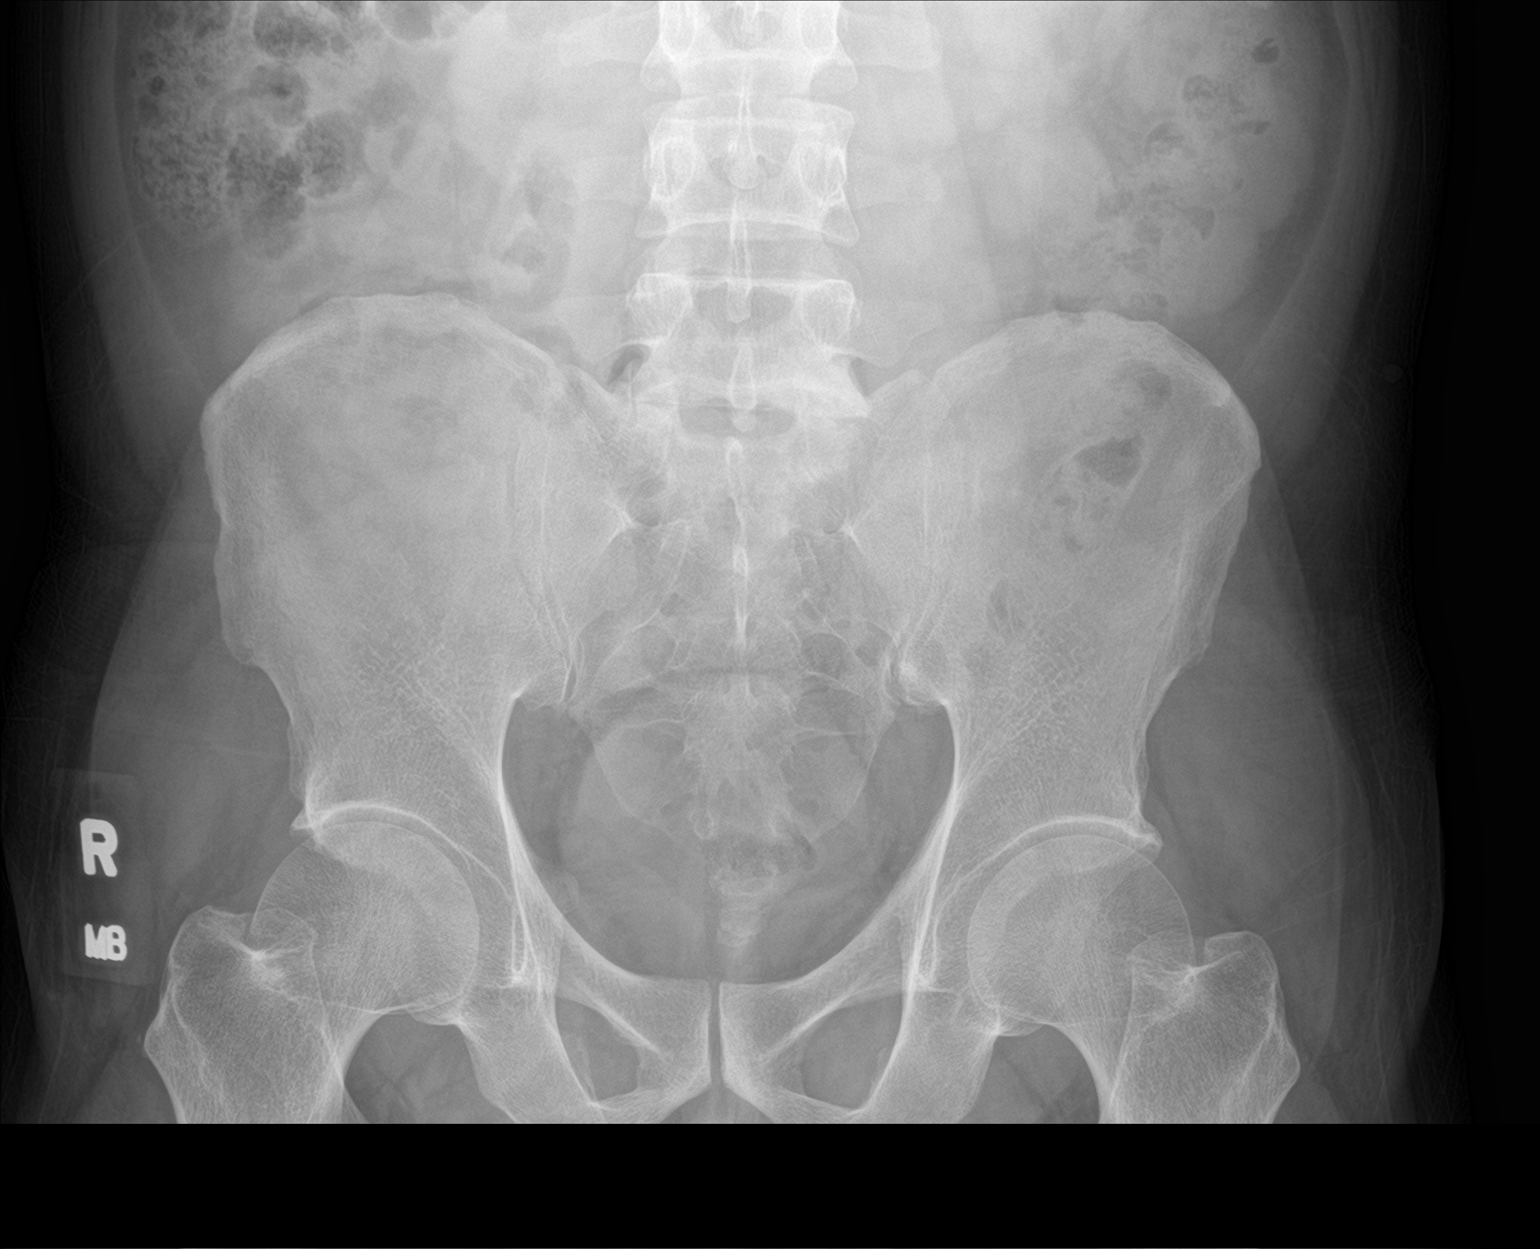

[3 of 3 positions shown; findings below may reference images not displayed]

FINDINGS: Scattered large and small bowel gas is noted. A mild amount of fecal
material is noted within the colon. No free air is seen. No abnormal
mass or abnormal calcifications are noted.
IMPRESSION: No acute abnormality noted.

## 2021-07-17 ENCOUNTER — Other Ambulatory Visit: Payer: Self-pay | Admitting: Nephrology

## 2021-07-17 DIAGNOSIS — N1831 Chronic kidney disease, stage 3a: Secondary | ICD-10-CM

## 2021-07-23 ENCOUNTER — Ambulatory Visit
Admission: RE | Admit: 2021-07-23 | Discharge: 2021-07-23 | Disposition: A | Discharge status: Home / Self Care | Payer: Medicaid Other | Source: Ambulatory Visit | Attending: Nephrology | Admitting: Nephrology

## 2021-07-23 DIAGNOSIS — N1831 Chronic kidney disease, stage 3a: Secondary | ICD-10-CM

## 2023-08-26 ENCOUNTER — Other Ambulatory Visit (HOSPITAL_COMMUNITY): Payer: Self-pay | Admitting: Nephrology

## 2023-08-26 DIAGNOSIS — R809 Proteinuria, unspecified: Secondary | ICD-10-CM

## 2023-09-21 ENCOUNTER — Other Ambulatory Visit: Payer: Self-pay

## 2024-01-07 ENCOUNTER — Other Ambulatory Visit (HOSPITAL_COMMUNITY): Payer: Self-pay | Admitting: Nephrology

## 2024-01-07 DIAGNOSIS — N1832 Chronic kidney disease, stage 3b: Secondary | ICD-10-CM

## 2024-01-07 DIAGNOSIS — R809 Proteinuria, unspecified: Secondary | ICD-10-CM

## 2024-01-28 ENCOUNTER — Other Ambulatory Visit: Payer: Self-pay | Admitting: Physician Assistant

## 2024-01-28 DIAGNOSIS — N1832 Chronic kidney disease, stage 3b: Secondary | ICD-10-CM

## 2024-01-30 NOTE — Progress Notes (Signed)
 Chief Complaint: Progressive kidney disease  Referring Provider(s): Anthony Sar  Supervising Physician: Irish Lack  Patient Status: [facility] - Out-pt  History of Present Illness: Kenneth Diaz is a 48 y.o. male with progressive kidney disease with most recent creatinine 2.26.  This is presumably due to hypertension, diabetes, or a combination of both.  He is here today for random renal biopsy.  He is NPO. No nausea/vomiting. No Fever/chills. ROS negative.  Patient is Full Code  Past Medical History:  Diagnosis Date   Diabetes mellitus without complication (HCC)    Hypertension     No past surgical history on file.  Allergies: Patient has no known allergies.  Medications: Prior to Admission medications   Medication Sig Start Date End Date Taking? Authorizing Provider  amLODipine (NORVASC) 10 MG tablet Take 10 mg by mouth daily. 01/25/16   [provider]  atorvastatin (LIPITOR) 10 MG tablet Take 10 mg by mouth daily. 02/24/16   [provider]  lisinopril (PRINIVIL,ZESTRIL) 10 MG tablet Take 10 mg by mouth daily. 01/09/16   [provider]  omeprazole (PRILOSEC) 20 MG capsule Take 1 capsule (20 mg total) by mouth daily for 14 days. 07/14/19 07/28/19  McDonald, Mia A, PA-C  sucralfate (CARAFATE) 1 g tablet Take 1 tablet (1 g total) by mouth 4 (four) times daily -  with meals and at bedtime. 07/14/19 08/13/19  McDonald, Mia A, PA-C  Dexlansoprazole (DEXILANT) 30 MG capsule Take 1 capsule (30 mg total) by mouth daily. Patient not taking: Reported on 07/14/2019 12/22/18 07/14/19  Eustace Moore, MD     Family History  Problem Relation Age of Onset   Diabetes Mother    Hypertension Mother     Social History   Socioeconomic History   Marital status: Married    Spouse name: Not on file   Number of children: Not on file   Years of education: Not on file   Highest education level: Not on file  Occupational History   Not on file  Tobacco Use    Smoking status: Never   Smokeless tobacco: Never  Vaping Use   Vaping status: Never Used  Substance and Sexual Activity   Alcohol use: No   Drug use: No   Sexual activity: Not on file  Other Topics Concern   Not on file  Social History Narrative   Not on file   Social Drivers of Health   Financial Resource Strain: Not on file  Food Insecurity: No Food Insecurity (12/04/2020)   Received from [facility], [facility]   Hunger Vital Sign    Worried About Running Out of Food in the Last Year: Never true    Ran Out of Food in the Last Year: Never true  Transportation Needs: Not on file  Physical Activity: Not on file  Stress: Not on file  Social Connections: Unknown (03/11/2022)   Received from [facility], [facility]   Social Network    Social Network: Not on file     Review of Systems: A 12 point ROS discussed and pertinent positives are indicated in the HPI above.  All other systems are negative.  Review of Systems  Vital Signs: There were no vitals taken for this visit.  Advance Care Plan: The advanced care place/surrogate decision maker was discussed at the time of visit and the patient did not wish to discuss or was not able to name a surrogate decision maker or provide an advance care plan.  Physical Exam Vitals reviewed.  Constitutional:      Appearance: Normal appearance.  HENT:     Head: Normocephalic and atraumatic.  Eyes:     Extraocular Movements: Extraocular movements intact.  Cardiovascular:     Rate and Rhythm: Normal rate and regular rhythm.  Pulmonary:     Effort: Pulmonary effort is normal. No respiratory distress.     Breath sounds: Normal breath sounds.  Abdominal:     General: There is no distension.     Palpations: Abdomen is soft.     Tenderness: There is no abdominal tenderness.  Musculoskeletal:        General: Normal range of motion.     Cervical back: Normal range of motion.  Skin:    General: Skin is warm and dry.   Neurological:     General: No focal deficit present.     Mental Status: He is alert and oriented to person, place, and time.  Psychiatric:        Mood and Affect: Mood normal.        Behavior: Behavior normal.        Thought Content: Thought content normal.        Judgment: Judgment normal.     Imaging: No results found.  Labs:  CBC: No results for input(s): "WBC", "HGB", "HCT", "PLT" in the last 8760 hours.  COAGS: No results for input(s): "INR", "APTT" in the last 8760 hours.  BMP: No results for input(s): "NA", "K", "CL", "CO2", "GLUCOSE", "BUN", "CALCIUM", "CREATININE", "GFRNONAA", "GFRAA" in the last 8760 hours.  Invalid input(s): "CMP"  LIVER FUNCTION TESTS: No results for input(s): "BILITOT", "AST", "ALT", "ALKPHOS", "PROT", "ALBUMIN" in the last 8760 hours.  TUMOR MARKERS: No results for input(s): "AFPTM", "CEA", "CA199", "CHROMGRNA" in the last 8760 hours.  Assessment and Plan:  Chronic Kidney disease stage 3  Will proceed with image guided random renal biopsy today by Dr. Fredia Sorrow.  Risks and benefits of random renal biopsy was discussed with the patient and/or patient's family including, but not limited to bleeding, infection, damage to adjacent structures or low yield requiring additional tests.  All of the questions were answered and there is agreement to proceed.  Consent signed and in chart.   Thank you for allowing our service to participate in Kenneth Diaz 's care.  Electronically Signed: Gwynneth Macleod, PA-C   01/30/2024, 1:42 PM      I spent a total of  30 Minutes   in face to face in clinical consultation, greater than 50% of which was counseling/coordinating care for random renal biopsy  (A copy of this note was sent to the referring provider and the time of visit.)

## 2024-01-31 ENCOUNTER — Other Ambulatory Visit: Payer: Self-pay

## 2024-01-31 ENCOUNTER — Ambulatory Visit (HOSPITAL_COMMUNITY)
Admission: RE | Admit: 2024-01-31 | Discharge: 2024-01-31 | Disposition: A | Discharge status: Home / Self Care | Source: Ambulatory Visit | Attending: Nephrology | Admitting: Nephrology

## 2024-01-31 DIAGNOSIS — I129 Hypertensive chronic kidney disease with stage 1 through stage 4 chronic kidney disease, or unspecified chronic kidney disease: Secondary | ICD-10-CM | POA: Diagnosis not present

## 2024-01-31 DIAGNOSIS — R809 Proteinuria, unspecified: Secondary | ICD-10-CM | POA: Diagnosis not present

## 2024-01-31 DIAGNOSIS — N183 Chronic kidney disease, stage 3 unspecified: Secondary | ICD-10-CM | POA: Diagnosis present

## 2024-01-31 DIAGNOSIS — E1122 Type 2 diabetes mellitus with diabetic chronic kidney disease: Secondary | ICD-10-CM | POA: Diagnosis not present

## 2024-01-31 DIAGNOSIS — N1832 Chronic kidney disease, stage 3b: Secondary | ICD-10-CM | POA: Insufficient documentation

## 2024-01-31 LAB — CBC
HCT: 51.1 % (ref 39.0–52.0)
Hemoglobin: 17.4 g/dL — ABNORMAL HIGH (ref 13.0–17.0)
MCH: 29.2 pg (ref 26.0–34.0)
MCHC: 34.1 g/dL (ref 30.0–36.0)
MCV: 85.9 fL (ref 80.0–100.0)
Platelets: 209 10*3/uL (ref 150–400)
RBC: 5.95 MIL/uL — ABNORMAL HIGH (ref 4.22–5.81)
RDW: 12.8 % (ref 11.5–15.5)
WBC: 7.7 10*3/uL (ref 4.0–10.5)
nRBC: 0 % (ref 0.0–0.2)

## 2024-01-31 LAB — GLUCOSE, CAPILLARY
Glucose-Capillary: 133 mg/dL — ABNORMAL HIGH (ref 70–99)
Glucose-Capillary: 101 mg/dL — ABNORMAL HIGH (ref 70–99)

## 2024-01-31 LAB — PROTIME-INR
INR: 1 (ref 0.8–1.2)
Prothrombin Time: 13.3 s (ref 11.4–15.2)

## 2024-01-31 MED ORDER — HYDRALAZINE HCL 20 MG/ML IJ SOLN
INTRAMUSCULAR | Status: AC
  Filled 2024-01-31: qty 1

## 2024-01-31 MED ORDER — MIDAZOLAM HCL 2 MG/2ML IJ SOLN
INTRAMUSCULAR | Status: AC | PRN
  Administered 2024-01-31: 1 mg via INTRAVENOUS

## 2024-01-31 MED ORDER — LIDOCAINE HCL (PF) 1 % IJ SOLN
7.0000 mL | Freq: Once | INTRAMUSCULAR | Status: AC
  Administered 2024-01-31: 7 mL via INTRADERMAL

## 2024-01-31 MED ORDER — HYDRALAZINE HCL 20 MG/ML IJ SOLN
10.0000 mg | Freq: Once | INTRAMUSCULAR | Status: AC
  Administered 2024-01-31: 10 mg via INTRAVENOUS

## 2024-01-31 MED ORDER — FENTANYL CITRATE (PF) 100 MCG/2ML IJ SOLN
INTRAMUSCULAR | Status: AC
  Filled 2024-01-31: qty 2

## 2024-01-31 MED ORDER — GELATIN ABSORBABLE 12-7 MM EX MISC
1.0000 | Freq: Once | CUTANEOUS | Status: AC
  Administered 2024-01-31: 1 via TOPICAL

## 2024-01-31 MED ORDER — FENTANYL CITRATE (PF) 100 MCG/2ML IJ SOLN
INTRAMUSCULAR | Status: AC | PRN
  Administered 2024-01-31: 50 ug via INTRAVENOUS

## 2024-01-31 MED ORDER — MIDAZOLAM HCL 2 MG/2ML IJ SOLN
INTRAMUSCULAR | Status: AC
  Filled 2024-01-31: qty 2

## 2024-01-31 NOTE — Procedures (Signed)
 Interventional Radiology Procedure Note  Procedure: US Guided Biopsy of left kidney  Complications: None  Estimated Blood Loss: < 10 mL  Findings: 18 G core biopsy of left lower pole renal cortex performed under US3 guidance.  Two core samples obtained and sent to Pathology.  Jodi Marble. Fredia Sorrow, M.D Pager:  [phone]

## 2024-02-03 ENCOUNTER — Encounter (HOSPITAL_COMMUNITY): Payer: Self-pay

## 2024-02-03 LAB — SURGICAL PATHOLOGY

## 2024-06-17 LAB — COLOGUARD: COLOGUARD: NEGATIVE
